# Patient Record
Sex: Female | Born: 1990 | Hispanic: Yes | Marital: Married | Smoking: Never smoker
Health system: Southern US, Community
[De-identification: ages and names within clinical notes are randomized; demographics above are authoritative.]

## PROBLEM LIST (undated history)

## (undated) ENCOUNTER — Inpatient Hospital Stay (HOSPITAL_COMMUNITY): Payer: Self-pay

## (undated) DIAGNOSIS — Z8759 Personal history of other complications of pregnancy, childbirth and the puerperium: Secondary | ICD-10-CM

## (undated) DIAGNOSIS — O24419 Gestational diabetes mellitus in pregnancy, unspecified control: Secondary | ICD-10-CM

## (undated) HISTORY — DX: Gestational diabetes mellitus in pregnancy, unspecified control: O24.419

## (undated) HISTORY — DX: Personal history of other complications of pregnancy, childbirth and the puerperium: Z87.59

---

## 2011-06-30 ENCOUNTER — Emergency Department (HOSPITAL_COMMUNITY)
Admission: EM | Admit: 2011-06-30 | Discharge: 2011-07-01 | Disposition: A | Discharge status: Home / Self Care | Payer: Self-pay | Attending: Emergency Medicine | Admitting: Emergency Medicine

## 2011-06-30 ENCOUNTER — Encounter: Payer: Self-pay | Admitting: Emergency Medicine

## 2011-06-30 DIAGNOSIS — R109 Unspecified abdominal pain: Secondary | ICD-10-CM | POA: Insufficient documentation

## 2011-06-30 DIAGNOSIS — O039 Complete or unspecified spontaneous abortion without complication: Secondary | ICD-10-CM

## 2011-06-30 DIAGNOSIS — N390 Urinary tract infection, site not specified: Secondary | ICD-10-CM

## 2011-06-30 DIAGNOSIS — N898 Other specified noninflammatory disorders of vagina: Secondary | ICD-10-CM | POA: Insufficient documentation

## 2011-06-30 LAB — CBC
HCT: 36.3 % (ref 36.0–46.0)
Hemoglobin: 12.3 g/dL (ref 12.0–15.0)
MCV: 83.6 fL (ref 78.0–100.0)
RBC: 4.34 MIL/uL (ref 3.87–5.11)
WBC: 8.6 10*3/uL (ref 4.0–10.5)

## 2011-06-30 LAB — BASIC METABOLIC PANEL
CO2: 25 mEq/L (ref 19–32)
Chloride: 103 mEq/L (ref 96–112)
Creatinine, Ser: 0.65 mg/dL (ref 0.50–1.10)
GFR calc Af Amer: >90 mL/min (ref >90)
Potassium: 3.3 mEq/L — ABNORMAL LOW (ref 3.5–5.1)
Sodium: 138 mEq/L (ref 135–145)

## 2011-06-30 LAB — DIFFERENTIAL
Basophils Absolute: 0 10*3/uL (ref 0.0–0.1)
Eosinophils Relative: 4 % (ref 0–5)
Lymphocytes Relative: 27 % (ref 12–46)
Lymphs Abs: 2.3 10*3/uL (ref 0.7–4.0)
Monocytes Absolute: 0.5 10*3/uL (ref 0.1–1.0)
Monocytes Relative: 6 % (ref 3–12)
Neutro Abs: 5.4 10*3/uL (ref 1.7–7.7)

## 2011-06-30 NOTE — ED Notes (Signed)
PT. REPORTS VAGINAL BLEEDING ONSET YESTERDAY ,  LOWER ABDOMINAL CRAMPING , DENIES VOMMITTING OR DIARRHEA . LMP  SEPT. 15 , 2012 - DENIES PRENATAL VISITS .

## 2011-06-30 NOTE — ED Notes (Signed)
Pt reports pelvic cramping and vaginal bleeding; Pad had moderate amount of bright red blood. No clots.

## 2011-07-01 ENCOUNTER — Emergency Department (HOSPITAL_COMMUNITY): Payer: Self-pay

## 2011-07-01 LAB — URINALYSIS, ROUTINE W REFLEX MICROSCOPIC
Glucose, UA: NEGATIVE mg/dL
Ketones, ur: 15 mg/dL — AB
Nitrite: NEGATIVE
Specific Gravity, Urine: 1.015 (ref 1.005–1.030)
pH: 6.5 (ref 5.0–8.0)

## 2011-07-01 LAB — URINE MICROSCOPIC-ADD ON

## 2011-07-01 LAB — TYPE AND SCREEN: Antibody Screen: NEGATIVE

## 2011-07-01 LAB — HCG, QUANTITATIVE, PREGNANCY: hCG, Beta Chain, Quant, S: 4600 m[IU]/mL — ABNORMAL HIGH (ref <5)

## 2011-07-01 MED ORDER — IBUPROFEN 800 MG PO TABS: ORAL_TABLET | ORAL | Status: DC

## 2011-07-01 MED ORDER — NITROFURANTOIN MONOHYD MACRO 100 MG PO CAPS
100.0000 mg | ORAL_CAPSULE | ORAL | Status: AC
  Administered 2011-07-01: 100 mg via ORAL
  Filled 2011-07-01: qty 1

## 2011-07-01 MED ORDER — IBUPROFEN 800 MG PO TABS
800.0000 mg | ORAL_TABLET | Freq: Once | ORAL | Status: AC
  Administered 2011-07-01: 800 mg via ORAL
  Filled 2011-07-01: qty 1

## 2011-07-01 MED ORDER — NITROFURANTOIN MONOHYD MACRO 100 MG PO CAPS: 100.0000 mg | ORAL_CAPSULE | Freq: Two times a day (BID) | ORAL | Status: AC

## 2011-07-01 NOTE — ED Notes (Signed)
Pt still in US

## 2011-07-01 NOTE — ED Notes (Signed)
MD at bedside with RN; interpreter phone being utilized to explain plan of care.

## 2011-07-01 NOTE — ED Notes (Signed)
Patient transported to Ultrasound 

## 2011-07-01 NOTE — ED Provider Notes (Signed)
History     CSN: [id number] Arrival date & time: 06/30/2011  9:05 PM   First MD Initiated Contact with Patient 07/01/11 0003      Chief Complaint  Patient presents with  . Vaginal Bleeding    (Consider location/radiation/quality/duration/timing/severity/associated sxs/prior treatment) HPI (History is obtained with the assistance of [employer] interpreter service.) This is a 19 year old Hispanic female with a complaint of vaginal bleeding since yesterday. It is associated with lower abdominal cramping. Her last menstrual period was September 15 of this year. She's not sure if she is pregnant. The bleeding is heavier than a period and is accompanied by clots. She is also having suprapubic pain that began about 7:30. She cannot characterize this pain other than to state that it feels like a prior miscarriage. This pain is moderate to severe and worse with palpation or movement. She denies nausea or vomiting. She denies fever or chills.  History reviewed. No pertinent past medical history.  History reviewed. No pertinent past surgical history.  No family history on file.  History  Substance Use Topics  . Smoking status: Never Smoker   . Smokeless tobacco: Not on file  . Alcohol Use: No    OB History    Grav Para Term Preterm Abortions TAB SAB Ect Mult Living                  Review of Systems  All other systems reviewed and are negative.    Allergies  Review of patient's allergies indicates no known allergies.  Home Medications  No current outpatient prescriptions on file.  BP 99/66  Pulse 77  Temp(Src) 97.3 F (36.3 C) (Oral)  Resp 18  LMP 04/11/2011  Physical Exam General: Well-developed, well-nourished female in no acute distress; appearance consistent with age of record HENT: normocephalic, atraumatic Eyes: Normal appearance Neck: supple Heart: regular rate and rhythm Lungs: clear to auscultation bilaterally Abdomen: soft; suprapubic tenderness; nondistended;  no masses or hepatosplenomegaly; bowel sounds present GU: Normal external genitalia; blood and clots in vagina; cervical os closed; urine tenderness Extremities: No deformity; full range of motion; pulses normal Neurologic: Awake, alert and oriented; motor function intact in all extremities and symmetric; no facial droop Skin: Warm and dry    ED Course  Procedures (including critical care time)     MDM   Nursing notes and vitals signs, including pulse oximetry, reviewed.  Summary of this visit's results, reviewed by myself:  Labs:  Results for orders placed during the hospital encounter of 06/30/11  URINALYSIS, ROUTINE W REFLEX MICROSCOPIC      Component Value Range   Color, Urine RED (*) YELLOW    APPearance TURBID (*) CLEAR    Specific Gravity, Urine 1.015  1.005 - 1.030    pH 6.5  5.0 - 8.0    Glucose, UA NEGATIVE  NEGATIVE (mg/dL)   Hgb urine dipstick LARGE (*) NEGATIVE    Bilirubin Urine MODERATE (*) NEGATIVE    Ketones, ur 15 (*) NEGATIVE (mg/dL)   Protein, ur 409 (*) NEGATIVE (mg/dL)   Urobilinogen, UA 1.0  0.0 - 1.0 (mg/dL)   Nitrite NEGATIVE  NEGATIVE    Leukocytes, UA MODERATE (*) NEGATIVE   BASIC METABOLIC PANEL      Component Value Range   Sodium 138  135 - 145 (mEq/L)   Potassium 3.3 (*) 3.5 - 5.1 (mEq/L)   Chloride 103  96 - 112 (mEq/L)   CO2 25  19 - 32 (mEq/L)   Glucose, Bld 89  70 - 99 (mg/dL)   BUN 13  6 - 23 (mg/dL)   Creatinine, Ser 4.09  0.50 - 1.10 (mg/dL)   Calcium 9.3  8.4 - 81.1 (mg/dL)   GFR calc non Af Amer >90  >90 (mL/min)   GFR calc Af Amer >90  >90 (mL/min)  CBC      Component Value Range   WBC 8.6  4.0 - 10.5 (K/uL)   RBC 4.34  3.87 - 5.11 (MIL/uL)   Hemoglobin 12.3  12.0 - 15.0 (g/dL)   HCT 91.4  78.2 - 95.6 (%)   MCV 83.6  78.0 - 100.0 (fL)   MCH 28.3  26.0 - 34.0 (pg)   MCHC 33.9  30.0 - 36.0 (g/dL)   RDW 21.3  08.6 - 57.8 (%)   Platelets 248  150 - 400 (K/uL)  DIFFERENTIAL      Component Value Range   Neutrophils  Relative 63  43 - 77 (%)   Neutro Abs 5.4  1.7 - 7.7 (K/uL)   Lymphocytes Relative 27  12 - 46 (%)   Lymphs Abs 2.3  0.7 - 4.0 (K/uL)   Monocytes Relative 6  3 - 12 (%)   Monocytes Absolute 0.5  0.1 - 1.0 (K/uL)   Eosinophils Relative 4  0 - 5 (%)   Eosinophils Absolute 0.4  0.0 - 0.7 (K/uL)   Basophils Relative 1  0 - 1 (%)   Basophils Absolute 0.0  0.0 - 0.1 (K/uL)  POCT PREGNANCY, URINE      Component Value Range   Preg Test, Ur POSITIVE    TYPE AND SCREEN      Component Value Range   ABO/RH(D) O POS     Antibody Screen NEG     Sample Expiration 07/04/2011    HCG, QUANTITATIVE, PREGNANCY      Component Value Range   hCG, Beta Chain, Quant, S 4600 (*) <5 (mIU/mL)  URINE MICROSCOPIC-ADD ON      Component Value Range   Squamous Epithelial / LPF RARE  RARE    WBC, UA 11-20  <3 (WBC/hpf)   RBC / HPF TOO NUMEROUS TO COUNT  <3 (RBC/hpf)   Bacteria, UA FEW (*) RARE   ABO/RH      Component Value Range   ABO/RH(D) O POS     US Ob Comp Less 14 Wks  07/01/2011  *RADIOLOGY REPORT*  Clinical Data: Vaginal bleeding for 2 days.  OBSTETRIC <14 WK [place] AND TRANSVAGINAL OB US  Technique:  Both transabdominal and transvaginal ultrasound examinations were performed for complete evaluation of the gestation as well as the maternal uterus, adnexal regions, and pelvic cul-de-sac.  Transvaginal technique was performed to assess early pregnancy.  Comparison:  None.  Intrauterine gestational sac:  None seen Yolk sac: N/A Embryo: N/A Cardiac Activity: N/A  Maternal uterus/adnexae: The uterus is unremarkable in appearance.  It measures 8.5 x 4.1 x 4.9 cm.  The endometrial echo complex measures 1.7 cm in thickness. Mildly heterogeneous echogenicity along the endometrial echo complex at the lower uterine segment may reflect a small amount of blood within the endometrial canal.  The ovaries are unremarkable in appearance.  The right ovary measures 3.4 x 2.8 x 2.4 cm, while the left ovary measures 1.9 x 1.8 x 1.7 cm.   Limited Doppler evaluation demonstrates normal color Doppler blood flow with respect to both ovaries; there is no evidence for ovarian torsion.  No suspicious adnexal masses are seen; there is no definite evidence  of ectopic pregnancy.  No free fluid is seen within the pelvic cul-de-sac.  IMPRESSION: No intrauterine pregnancy seen.  This likely reflects a missed spontaneous abortion.  Suggestion of minimal blood along the endometrial canal at the lower uterine segment.  Original Report Authenticated By: Tonia Ghent, M.D.              Hanley Seamen, MD 07/01/11 [phone]

## 2011-07-01 NOTE — ED Notes (Signed)
RN explained that pt has urinary tract infection.

## 2011-07-01 NOTE — ED Notes (Signed)
Interpretter phone used to review d/c instructions and prescriptions. All questions answered; A&Ox3; no signs of distress; ambulated with a steady gait.

## 2011-07-03 LAB — URINE CULTURE: Colony Count: 75000

## 2012-07-01 ENCOUNTER — Ambulatory Visit: Payer: Self-pay | Admitting: Emergency Medicine

## 2012-07-01 VITALS — BP 95/71 | HR 87 | Temp 98.4°F | Resp 18 | Ht 61.5 in | Wt 157.0 lb

## 2012-07-01 DIAGNOSIS — Z3200 Encounter for pregnancy test, result unknown: Secondary | ICD-10-CM

## 2012-07-01 LAB — POCT URINE PREGNANCY: Preg Test, Ur: POSITIVE

## 2012-07-01 MED ORDER — PRENATAL VITAMINS 28-0.8 MG PO TABS: 1.0000 | ORAL_TABLET | Freq: Every day | ORAL | Status: DC

## 2012-07-01 NOTE — Progress Notes (Signed)
[facility] [address], [place] [address] [phone]  Date:  07/01/2012   Name:  Julie Allen   DOB:  Jun 12, 1991   MRN:  [id number]  PCP:  No primary provider on file.    Chief Complaint: Possible Pregnancy   History of Present Illness:  Julie Allen is a 21 y.o. very pleasant female patient who presents with the following:  Last menses in September.  Has positive  HPT.  Requests HCG here for documentation.  No other complaints.   There is no problem list on file for this patient.   No past medical history on file.  No past surgical history on file.  History  Substance Use Topics  . Smoking status: Never Smoker   . Smokeless tobacco: Not on file  . Alcohol Use: No    No family history on file.  No Known Allergies  Medication list has been reviewed and updated.  Current Outpatient Prescriptions on File Prior to Visit  Medication Sig Dispense Refill  . ibuprofen (ADVIL,MOTRIN) 800 MG tablet Take 1 tablet 3 times daily as needed for pain. Best taken with a meal  20 tablet  0    Review of Systems:  As per HPI, otherwise negative.    Physical Examination: Filed Vitals:   07/01/12 1054  BP: 95/71  Pulse: 87  Temp: 98.4 F (36.9 C)  Resp: 18   Filed Vitals:   07/01/12 1054  Height: 5' 1.5" (1.562 m)  Weight: 157 lb (71.215 kg)   Body mass index is 29.18 kg/(m^2). Ideal Body Weight: Weight in (lb) to have BMI = 25: 134.2    GEN: WDWN, NAD, Non-toxic, Alert & Oriented x 3 HEENT: Atraumatic, Normocephalic.  Ears and Nose: No external deformity. EXTR: No clubbing/cyanosis/edema NEURO: Normal gait.  PSYCH: Normally interactive. Conversant. Not depressed or anxious appearing.  Calm demeanor.    Assessment and Plan: Amenorrhea Pregnancy UCG Prenatal vitamins  Carmelina Dane, MD  Results for orders placed in visit on 07/01/12  POCT URINE PREGNANCY      Component Value Range   Preg Test, Ur Positive

## 2012-07-01 NOTE — Patient Instructions (Addendum)
Media planner (Pregnancy) Si planea quedar embarazada, es una buena idea concertar una cita de preconcepcin con el mdico para poder lograr un estilo de vida saludable ante de quedar embarazada. Esto incluye dieta, peso, ejercicio, el tomar vitaminas prenatales en especial cido flico (ayuda a prevenir defectos en el cerebro y la mdula espinal), evitar el alcohol, fumar, las drogas ilegales, problemas mdicos (diabetes, convulsiones), historial familiar de problemas genticos, condiciones de trabajo e inmunizaciones. Es mejor tener conocimiento de estas cosas y Field seismologist algo antes de quedar embarazada. Si est embarazada, es necesario que siga ciertas pautas para tener un beb sano. Es muy importante Optometrist controles prenatales adecuados y seguir las indicaciones del profesional que la asiste. La atencin prenatal incluye toda la asistencia mdica que usted recibe antes del nacimiento del beb. Esto ayuda a Risk analyst y [place]. INSTRUCCIONES PARA EL CUIDADO DOMICILIARIO  Comience las consultas prenatales alrededor de la 12 semana de embarazo o lo antes posible. Al principio generalmente se programan cada mes. Se hacen ms frecuentes en los 2 ltimos meses antes del parto. Es importante que concurra a todas las citas con el profesional y siga sus instrucciones con Engineer, site a los medicamentos que deba Risk manager, a la actividad fsica y a Engineer, technical sales.  Durante el embarazo debe obtener nutrientes para usted y para su beb. Consuma una dieta normal y bien balanceada. Elija alimentos como carne, pescado, [place] y otros productos lcteos, vegetales, frutas, panes integrales y Therapist, art cul es el aumento de [place] ideal, segn su peso y Chief Strategy Officer. Beba gran cantidad de lquidos. Trate de beber 8 vasos de lquidos por Training and development officer.  El alcohol se asocia a cierto nmero de defectos del nacimiento, incluyendo el sndrome de alcoholismo fetal. Lo mejor es evitarlo  completamente El cigarrillo causa nacimientos prematuros y bebs de bajo peso al Associate Professor. El consumo de alcohol y nicotina durante el embarazo tambin aumentan marcadamente la probabilidad de que el nio sea qumicamente dependiente en etapas posteriores de su vida y puede contribuir al sndrome de muerte sbita infantil (SMSI)  No consuma drogas.  Solo tome medicamentos prescriptos o de venta libre que le haya recomendado el profesional. Algunos medicamentos pueden causar problemas genticos y fsicos al beb  Las nuseas matinales pueden aliviarse si come Firefighter en la cama. Coma dos galletitas antes de levantarse por la maana.  Las relaciones sexuales pueden continuarse hasta casi el final del embarazo, si no se presentan otros problemas como prdida prematura (antes de tiempo) de lquido amnitico, Social research officer, government vaginal, dolor durante las relaciones sexuales o dolor abdominal (en el vientre).  Practique ejercicios con regularidad. Consulte con el profesional que la asiste si no sabe con certeza si determinados ejercicios son seguros.  No utilice la baera con agua caliente, baos turcos y saunas. Estos aumentan el riesgo de sufrir un desmayo o de prdida del conocimiento, y as Glass blower/designer usted o el beb. La natacin es un buen ejercicio. Descanse todo lo que pueda e incluya una siesta despus de almorzar siempre que le sea posible, especialmente durante el tercer trimestre.  Evite los olores y las sustancias qumicas txicas.  No use zapatos de tacones altos, podra perder el equilibrio y caer.  No levante objetos de ms de 2,5 kg. Si levanta un objeto, flexione las piernas y los muslos, y no la espalda.  Evite los viajes largos, Art gallery manager trimestre.  Si debe viajar fuera de la ciudad o de su [place], lleve [place]  copia de la historia clnica. SOLICITE ATENCIN MDICA DE INMEDIATO SI:  La temperatura oral se eleva sin motivo por encima de 102 F (38.9 C) o  segn le indique el profesional que lo asiste.  Tiene una prdida de lquido por la vagina. Si sospecha una ruptura de las [place], tmese la temperatura y llame al profesional para informarlo sobre esto.  Observa unas pequeas manchas o una hemorragia vaginal Notifique al profesional acerca de la cantidad y de cuntos apsitos est utilizando.  Contina teniendo nuseas y no obtiene alivio de los [employer] han [place], o vomita sangre o una sustancia similar a la borra del caf.  Presenta un dolor en la zona superior del abdomen.  Siente molestias en el ligamento redondo en la parte abdominal baja. El profesional que la asiste Hydrologist.  Siente pequeas contracciones del tero (matriz)  No siente que el beb se mueve, o percibe menos movimientos que antes.  Siente dolor al [employer].  Brett Fairy hemorragia vaginal anormal.  Tiene diarrea persistente.  Sufre una cefalea grave.  Tiene problemas visuales.  Comienza a sentir debilidad muscular.  Se siente mareada o sufre un desmayo.  Comienza a sentir falta de aire.  Siente dolor en el pecho.  Sufre dolor en la espalda que se irradia hacia la pierna y el pie.  Siente latidos cardacos irregulares o la frecuencia cardaca es muy rpida.  Aumenta excesivamente de peso en un perodo breve (2,5 kg en 3 a 5 das)  Se ve envuelta en una situacin de violencia domstica. Document Released: 04/22/2005 Document Revised: 10/05/2011 [facility] Patient Information 2013 [place], [place].

## 2012-08-16 ENCOUNTER — Other Ambulatory Visit: Payer: Self-pay

## 2012-08-16 DIAGNOSIS — Z331 Pregnant state, incidental: Secondary | ICD-10-CM

## 2012-08-16 NOTE — Progress Notes (Signed)
PRENATAL LABS ONLY Julie Allen

## 2012-08-17 LAB — OBSTETRIC PANEL
Basophils Absolute: 0 10*3/uL (ref 0.0–0.1)
Eosinophils Absolute: 0.2 10*3/uL (ref 0.0–0.7)
Eosinophils Relative: 3 % (ref 0–5)
Hepatitis B Surface Ag: NEGATIVE
MCH: 26.9 pg (ref 26.0–34.0)
MCV: 81.3 fL (ref 78.0–100.0)
Neutrophils Relative %: 73 % (ref 43–77)
Platelets: 309 10*3/uL (ref 150–400)
RBC: 4.91 MIL/uL (ref 3.87–5.11)
RDW: 16.7 % — ABNORMAL HIGH (ref 11.5–15.5)
WBC: 8.9 10*3/uL (ref 4.0–10.5)

## 2012-08-17 LAB — SICKLE CELL SCREEN: Sickle Cell Screen: NEGATIVE

## 2012-08-17 LAB — HIV ANTIBODY (ROUTINE TESTING W REFLEX): HIV: NONREACTIVE

## 2012-08-19 LAB — CULTURE, OB URINE: Colony Count: >=100000

## 2012-08-22 ENCOUNTER — Telehealth: Payer: Self-pay | Admitting: Family Medicine

## 2012-08-22 DIAGNOSIS — N390 Urinary tract infection, site not specified: Secondary | ICD-10-CM

## 2012-08-22 MED ORDER — CEPHALEXIN 500 MG PO CAPS: 500.0000 mg | ORAL_CAPSULE | Freq: Three times a day (TID) | ORAL | Status: DC

## 2012-08-22 NOTE — Telephone Encounter (Signed)
Called pt to inform her about positive urine culture.  Left message that she needs to call us back. Pt needs to start taking antibiotic for this but there is not pharmacy listed on her chart. Medication prescription printed and placed on my box.

## 2012-08-23 ENCOUNTER — Encounter: Payer: Self-pay | Admitting: Family Medicine

## 2012-08-23 ENCOUNTER — Ambulatory Visit (INDEPENDENT_AMBULATORY_CARE_PROVIDER_SITE_OTHER): Payer: Self-pay | Admitting: Family Medicine

## 2012-08-23 VITALS — BP 111/70 | Temp 98.3°F | Wt 157.4 lb

## 2012-08-23 DIAGNOSIS — N760 Acute vaginitis: Secondary | ICD-10-CM

## 2012-08-23 DIAGNOSIS — Z331 Pregnant state, incidental: Secondary | ICD-10-CM

## 2012-08-23 DIAGNOSIS — Z3490 Encounter for supervision of normal pregnancy, unspecified, unspecified trimester: Secondary | ICD-10-CM | POA: Insufficient documentation

## 2012-08-23 DIAGNOSIS — R8271 Bacteriuria: Secondary | ICD-10-CM | POA: Insufficient documentation

## 2012-08-23 DIAGNOSIS — N390 Urinary tract infection, site not specified: Secondary | ICD-10-CM

## 2012-08-23 LAB — GLUCOSE, CAPILLARY: Glucose-Capillary: 138 mg/dL — ABNORMAL HIGH (ref 70–99)

## 2012-08-23 LAB — POCT WET PREP (WET MOUNT)

## 2012-08-23 MED ORDER — CEPHALEXIN 500 MG PO CAPS: 500.0000 mg | ORAL_CAPSULE | Freq: Two times a day (BID) | ORAL | Status: DC

## 2012-08-23 NOTE — Assessment & Plan Note (Signed)
Urine Culture positive for enterococcus. Treated with Keflex. TOC in a week after completion of therapy.

## 2012-08-23 NOTE — Progress Notes (Signed)
  Subjective:    Julie Allen is a [id number] at [redacted]w[redacted]d being seen today for her first obstetrical visit.  Her obstetrical history is significant for Fetal Demise at 28 weeks of her first pregnancy and Pre-eclampsia with Classical C section on her second one. Patient reports no complaints. Denies headache, visual disturbances, abdominal pain, swelling or vaginal bleeding.   Filed Vitals:   08/23/12 1001  BP: 111/70  Temp: 98.3 F (36.8 C)  Weight: 157 lb 6.4 oz (71.396 kg)   HISTORY: OB History    Grav Para Term Preterm TAB SAB Ect Mult Living   4 2 1 1  1   1      # Outc Date GA Wgt Sex Del Lv   1 PRE 2008 28w   SVD SB   2 TRM 2010 37w 7lb11.5oz(3.5kg) F CCS Yes   Comments: c section secondary to pre-eclampsia   3 SAB 2012 8w  U  No   4 CUR         No past medical history on file. Past Surgical History  Procedure Date  . Cesarean section    Exam   Uterus:   Anteverted. Size corresponds with GA.  Pelvic Exam:    Perineum: No Hemorrhoids   Vulva: normal   Vagina:  normal mucosa, curdlike discharge, wet prep done   Cervix: multiparous appearance   Adnexa: normal adnexa               System: Breast:  normal appearance, no masses or tenderness   Skin: normal coloration and turgor, no rashes   Neurologic: oriented, normal   Extremities: normal strength, tone, and muscle mass   HEENT PERRLA   Mouth/Teeth mucous membranes moist, pharynx normal without lesions   Neck supple and no masses   Cardiovascular: regular rate and rhythm, no murmurs or gallops   Respiratory:  normal RR, chest clear, no wheezing or rales.   Abdomen: normal findings: vertical scar below umbilicus.   Urinary: urethral meatus normal. No CVA tenderness   Assessment:    Pregnancy: G1P0 Patient Active Problem List  Diagnosis  . Asymptomatic bacteriuria in pregnancy  . Pregnancy   Plan:   Initial labs reviewed. Pt has asymptomatic bacteriuria. Treatment with Keflex recommended, prescription  given. Will retest TOC next visit. 1h GTT drawn today. Varicella titer ordered as well.  Pap Smear, GC and Chlamydia and also wet prep obtained today. Hx of fetal demise. Consult and evaluation by High Risk OB (referral placed pt will be contacted with appointment schedule) OB U/S anatomy: Ordered and scheduled with Dr. Tawny Hopping for Jan 31 at 10:30 am. PHQ-9 scored 1( on item #4)  Pregnancy Medical Home risk tool filled. No problems identified.  Genetic Screening discussed Quad Screen: declined. Follow up in 4 weeks.   Lillia Abed 08/23/2012

## 2012-08-23 NOTE — Patient Instructions (Addendum)
Ha sido un placer conocerla hoy. MUCHAS FELICIDADES!!!!  Estas son las indicaciones/recommendaciones de esta visita.  1. Tiene una infeccion en la orina, por favor tome el medicamento recetado. En la proxima cita le haremos una prueba de [place] para ver si ya no tiene esa bacteria.   2. [place] cita para la consulta de [place] [facility]. Adine Madura a llamar por el telefono para informarle el dia y Public relations account executive.  3. Tambien necesita hacerse el ultrasonido anatomico que corresponde a su edad gestacional. Este es sumamente importante, no debe pasar de estas cuatro semanas.  4. Haga una cita conmigo en 4 semanas. (make an appointment with me in 4 weeks)

## 2012-08-24 LAB — VARICELLA ZOSTER ANTIBODY, IGG: Varicella IgG: <10 Index (ref <135.00)

## 2012-08-29 NOTE — Progress Notes (Signed)
Patient chart reviewed.  Agree with referral to HROB in light of IUFD at 28 weeks in prior pregnancy.  Will need UCx for TOC after treatment. Paula Compton, MD

## 2012-08-30 ENCOUNTER — Other Ambulatory Visit (INDEPENDENT_AMBULATORY_CARE_PROVIDER_SITE_OTHER): Payer: Self-pay

## 2012-08-30 DIAGNOSIS — Z331 Pregnant state, incidental: Secondary | ICD-10-CM

## 2012-08-30 LAB — GLUCOSE, CAPILLARY: Glucose-Capillary: 85 mg/dL (ref 70–99)

## 2012-08-30 NOTE — Progress Notes (Signed)
UNABLE TO CONTINUE WITH THE 3 HR GTT ,PT VOMITED. RESCHEDULED FOR 09/07/11 Julie Allen

## 2012-09-06 ENCOUNTER — Other Ambulatory Visit (INDEPENDENT_AMBULATORY_CARE_PROVIDER_SITE_OTHER): Payer: Self-pay

## 2012-09-06 DIAGNOSIS — Z331 Pregnant state, incidental: Secondary | ICD-10-CM

## 2012-09-06 DIAGNOSIS — Z3480 Encounter for supervision of other normal pregnancy, unspecified trimester: Secondary | ICD-10-CM

## 2012-09-06 LAB — GLUCOSE, CAPILLARY: Glucose-Capillary: 88 mg/dL (ref 70–99)

## 2012-09-06 NOTE — Progress Notes (Signed)
3 HR GTT DONE TODAY Julie Allen 

## 2012-09-07 LAB — GLUCOSE TOLERANCE, 3 HOURS
Glucose Tolerance, 2 hour: 137 mg/dL (ref 70–164)
Glucose, GTT - 3 Hour: 119 mg/dL (ref 70–144)

## 2012-09-15 ENCOUNTER — Ambulatory Visit (INDEPENDENT_AMBULATORY_CARE_PROVIDER_SITE_OTHER): Payer: Self-pay | Admitting: Obstetrics and Gynecology

## 2012-09-15 ENCOUNTER — Encounter: Payer: Self-pay | Admitting: Obstetrics and Gynecology

## 2012-09-15 VITALS — BP 110/71 | Temp 97.5°F | Wt 159.6 lb

## 2012-09-15 DIAGNOSIS — O09299 Supervision of pregnancy with other poor reproductive or obstetric history, unspecified trimester: Secondary | ICD-10-CM | POA: Insufficient documentation

## 2012-09-15 DIAGNOSIS — O34219 Maternal care for unspecified type scar from previous cesarean delivery: Secondary | ICD-10-CM | POA: Insufficient documentation

## 2012-09-15 DIAGNOSIS — Z23 Encounter for immunization: Secondary | ICD-10-CM

## 2012-09-15 LAB — POCT URINALYSIS DIP (DEVICE)
Ketones, ur: NEGATIVE mg/dL
Protein, ur: NEGATIVE mg/dL

## 2012-09-15 MED ORDER — INFLUENZA VIRUS VACC SPLIT PF IM SUSP
0.5000 mL | Freq: Once | INTRAMUSCULAR | Status: AC
  Administered 2012-09-15: 0.5 mL via INTRAMUSCULAR

## 2012-09-15 NOTE — Progress Notes (Signed)
Patient doing well without complaints. Informed of abnormal pap smear and need for colposcopy. Discussed TOLAC vs rpt c-section- patient more inclined to TOLAC.

## 2012-09-15 NOTE — Progress Notes (Signed)
Pulse-89 Weight gain 25-35lb New ob packet given Flu consent signed

## 2012-09-21 ENCOUNTER — Ambulatory Visit (INDEPENDENT_AMBULATORY_CARE_PROVIDER_SITE_OTHER): Payer: Self-pay | Admitting: Family Medicine

## 2012-09-21 VITALS — BP 117/66 | Temp 98.7°F | Wt 159.0 lb

## 2012-09-21 DIAGNOSIS — Z331 Pregnant state, incidental: Secondary | ICD-10-CM

## 2012-09-21 NOTE — Patient Instructions (Addendum)
Su embarazo se considera de [address]. Julie Allen en adelante necesita seguirse con la clinica de "High Risk OB" que esta en [facility] de la [place] donde se antendio previamente. Su proxima cita con ellos sera el mes proximo el dia 6 y el dia 13.

## 2012-09-21 NOTE — Progress Notes (Signed)
Julie Allen is a 22 y.o. [id number] at [redacted]w[redacted]d for routine follow up.  She reports no complaints. See flow sheet for details.  A/P: Pregnancy at [redacted]w[redacted]d. Doing well.   Pregnancy issues include:  -High risk for Hx of IUFD at 28 weeks. 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  -Pt also has prior Vertical C Section planning for TOLAC -Hx of Asymptomatic bacteriuria treated pending TOC. -1h GTT elevated but 3h GTT wnl. Anatomy scan reviewed, no problems noted.  Preterm labor precautions reviewed. Discussed with pt that from now on she needs to follow up with HROB clinic.

## 2012-09-29 ENCOUNTER — Encounter: Payer: Self-pay | Admitting: Obstetrics & Gynecology

## 2012-09-29 ENCOUNTER — Ambulatory Visit (INDEPENDENT_AMBULATORY_CARE_PROVIDER_SITE_OTHER): Payer: Self-pay | Admitting: Obstetrics & Gynecology

## 2012-09-29 VITALS — BP 114/72 | HR 91 | Temp 97.9°F | Ht 61.5 in | Wt 161.9 lb

## 2012-10-04 NOTE — Patient Instructions (Signed)
Return to clinic for any scheduled appointments or for any gynecologic concerns as needed.   

## 2012-10-04 NOTE — Progress Notes (Signed)
GYN Clinic Attending Note  Patient was sent here for colposcopy for LGSIL pap smear on 08/23/12.  As per ASCCP guidelines, she does not need colposcopy and just needs repeat cytology in one year.  This was explained to the patient via a Spanish interpreter.  Patient will follow up her her PCP for routine care.  Jaynie Collins, MD, FACOG Attending Obstetrician & Gynecologist Faculty Practice, [facility] [place]

## 2012-10-06 ENCOUNTER — Ambulatory Visit (INDEPENDENT_AMBULATORY_CARE_PROVIDER_SITE_OTHER): Payer: Self-pay | Admitting: Advanced Practice Midwife

## 2012-10-06 ENCOUNTER — Other Ambulatory Visit: Payer: Self-pay | Admitting: Obstetrics & Gynecology

## 2012-10-06 ENCOUNTER — Encounter: Payer: Self-pay | Admitting: Obstetrics & Gynecology

## 2012-10-06 VITALS — BP 115/79 | Temp 97.6°F | Wt 164.1 lb

## 2012-10-06 DIAGNOSIS — Z3490 Encounter for supervision of normal pregnancy, unspecified, unspecified trimester: Secondary | ICD-10-CM

## 2012-10-06 LAB — POCT URINALYSIS DIP (DEVICE)
Bilirubin Urine: NEGATIVE
Glucose, UA: NEGATIVE mg/dL
Hgb urine dipstick: NEGATIVE
Ketones, ur: NEGATIVE mg/dL
Nitrite: POSITIVE — AB
Protein, ur: NEGATIVE mg/dL
Specific Gravity, Urine: 1.02 (ref 1.005–1.030)
Urobilinogen, UA: 0.2 mg/dL (ref 0.0–1.0)
pH: 6.5 (ref 5.0–8.0)

## 2012-10-06 NOTE — Patient Instructions (Signed)
Embarazo - Segundo trimestre (Pregnancy - Second Trimester) El segundo trimestre del embarazo (del 3 al 6mes) es un perodo de evolucin rpida para usted y el beb. Hacia el final del sexto mes, el beb mide aproximadamente 23 cm y pesa 680 g. Comenzar a sentir los movimientos del beb entre las 18 y las 20 semanas de embarazo. Podr sentir las pataditas ("quickening en ingls"). Hay un rpido aumento de peso. Puede segregar un lquido claro (calostro) de las mamas. Quizs sienta pequeas contracciones en el vientre (tero) Esto se conoce como falso trabajo de parto o contracciones de Braxton-Hicks. Es como una prctica del trabajo de parto que se produce cuando el beb est listo para salir. Generalmente los problemas de vmitos matinales ya se han superado hacia el final del primer trimestre. Algunas mujeres desarrollan pequeas manchas oscuras (que se denominan cloasma, mscara del embarazo) en la cara que normalmente se van luego del nacimiento del beb. La exposicin al sol empeora las manchas. Puede desarrollarse acn en algunas mujeres embarazadas, y puede desaparecer en aquellas que ya tienen acn. EXAMENES PRENATALES  Durante los exmenes prenatales, deber seguir realizando pruebas de sangre, segn avance el embarazo. Estas pruebas se realizan para controlar su salud y la del beb. Tambin se realizan anlisis de sangre para conocer los niveles de hemoglobina. La anemia (bajo nivel de hemoglobina) es frecuente durante el embarazo. Para prevenirla, se administran hierro y vitaminas. Tambin se le realizarn exmenes para saber si tiene diabetes entre las 24 y las 28 semanas del embarazo. Podrn repetirle algunas de las pruebas que le hicieron previamente.  En cada visita le medirn el tamao del tero. Esto se realiza para asegurarse de que el beb est creciendo correctamente de acuerdo al estado del embarazo.  Tambin en cada visita prenatal controlarn su presin arterial. Esto se realiza  para asegurarse de que no tenga toxemia.  Se controlar su orina para asegurarse de que no tenga infecciones, diabetes o protena en la orina.  Se controlar su peso regularmente para asegurarse que el aumento ocurre al ritmo indicado. Esto se hace para asegurarse que usted y el beb tienen una evolucin normal.  En algunas ocasiones se realiza una prueba de ultrasonido para confirmar el correcto desarrollo y evolucin del beb. Esta prueba se realiza con ondas sonoras inofensivas para el beb, de modo que el profesional pueda calcular ms precisamente la fecha del parto. Algunas veces se realizan pruebas especializadas del lquido amnitico que rodea al beb. Esta prueba se denomina amniocentesis. El lquido amnitico se obtiene introduciendo una aguja en el vientre (abdomen). Se realiza para controlar los cromosomas en aquellos casos en los que existe alguna preocupacin acerca de algn problema gentico que pueda sufrir el beb. En ocasiones se lleva a cabo cerca del final del embarazo, si es necesario inducir al parto. En este caso se realiza para asegurarse que los pulmones del beb estn lo suficientemente maduros como para que pueda vivir fuera del tero. CAMBIOS QUE OCURREN EN EL SEGUNDO TRIMESTRE DEL EMBARAZO Su organismo atravesar numerosos cambios durante el embarazo. Estos pueden variar de una persona a otra. Converse con el profesional que la asiste acerca los cambios que usted note y que la preocupen.  Durante el segundo trimestre probablemente sienta un aumento del apetito. Es normal tener "antojos" de ciertas comidas. Esto vara de una persona a otra y de un embarazo a otro.  El abdomen inferior comenzar a abultarse.  Podr tener la necesidad de orinar con ms frecuencia debido a que   el tero y el beb presionan sobre la vejiga. Tambin es frecuente contraer ms infecciones urinarias durante el embarazo (dolor al orinar). Puede evitarlas bebiendo gran cantidad de lquidos y vaciando  la vejiga antes y despus de mantener relaciones sexuales.  Podrn aparecer las primeras estras en las caderas, abdomen y mamas. Estos son cambios normales del cuerpo durante el embarazo. No existen medicamentos ni ejercicios que puedan prevenir estos cambios.  Es posible que comience a desarrollar venas inflamadas y abultadas (varices) en las piernas. El uso de medias de descanso, elevar sus pies durante 15 minutos, 3 a 4 veces al da y limitar la sal en su dieta ayuda a aliviar el problema.  Podr sentir acidez gstrica a medida que el tero crece y presiona contra el estmago. Puede tomar anticidos, con la autorizacin de su mdico, para aliviar este problema. Tambin es til ingerir pequeas comidas 4 a 5 veces al da.  La constipacin puede tratarse con un laxante o agregando fibra a su dieta. Beber grandes cantidades de lquidos, comer vegetales, frutas y granos integrales es de gran ayuda.  Tambin es beneficioso practicar actividad fsica. Si ha sido una persona activa hasta el embarazo, podr continuar con la mayora de las actividades durante el mismo. Si ha sido menos activa, puede ser beneficioso que comience con un programa de ejercicios, como realizar caminatas.  Puede desarrollar hemorroides (vrices en el recto) hacia el final del segundo trimestre. Tomar baos de asiento tibios y utilizar cremas recomendadas por el profesional que lo asiste sern de ayuda para los problemas de hemorroides.  Tambin podr sentir dolor de espalda durante este momento de su embarazo. Evite levantar objetos pesados, utilice zapatos de taco bajo y mantenga una buena postura para ayudar a reducir los problemas de espalda.  Algunas mujeres embarazadas desarrollan hormigueo y adormecimiento de la mano y los dedos debido a la hinchazn y compresin de los ligamentos de la mueca (sndrome del tnel carpiano). Esto desaparece una vez que el beb nace.  Como sus pechos se agrandan, necesitar un sujetador  ms grande. Use un sostn de soporte, cmodo y de algodn. No utilice un sostn para amamantar hasta el ltimo mes de embarazo si va a amamantar al beb.  Podr observar una lnea oscura desde el ombligo hacia la zona pbica denominada linea nigra.  Podr observar que sus mejillas se ponen coloradas debido al aumento de flujo sanguneo en la cara.  Podr desarrollar "araitas" en la cara, cuello y pecho. Esto desaparece una vez que el beb nace. INSTRUCCIONES PARA EL CUIDADO DOMICILIARIO  Es extremadamente importante que evite el cigarrillo, hierbas medicinales, alcohol y las drogas no prescriptas durante el embarazo. Estas sustancias qumicas afectan la formacin y el desarrollo del beb. Evite estas sustancias durante todo el embarazo para asegurar el nacimiento de un beb sano.  La mayor parte de los cuidados que se aconsejan son los mismos que los indicados para el primer trimestre del embarazo. Cumpla con las citas tal como se le indic. Siga las instrucciones del profesional que lo asiste con respecto al uso de los medicamentos, el ejercicio y la dieta.  Durante el embarazo debe obtener nutrientes para usted y para su beb. Consuma alimentos balanceados a intervalos regulares. Elija alimentos como carne, pescado, leche y otros productos lcteos descremados, vegetales, frutas, panes integrales y cereales. El profesional le informar cul es el aumento de peso ideal.  Las relaciones sexuales fsicas pueden continuarse hasta cerca del fin del embarazo si no existen otros problemas. Estos   problemas pueden ser la prdida temprana (prematura) de lquido amnitico de las membranas, sangrado vaginal, dolor abdominal u otros problemas mdicos o del embarazo.  Realice actividad fsica todos los das, si no tiene restricciones. Consulte con el profesional que la asiste si no sabe con certeza si determinados ejercicios son seguros. El mayor aumento de peso tiene lugar durante los ltimos 2 trimestres del  embarazo. El ejercicio la ayudar a:  Controlar su peso.  Ponerla en forma para el parto.  Ayudarla a perder peso luego de haber dado a luz.  Use un buen sostn o como los que se usan para hacer deportes para aliviar la sensibilidad de las mamas. Tambin puede serle til si lo usa mientras duerme. Si pierde calostro, podr utilizar apsitos en el sostn.  No utilice la baera con agua caliente, baos turcos y saunas durante el embarazo.  Utilice el cinturn de seguridad sin excepcin cuando conduzca. Este la proteger a usted y al beb en caso de accidente.  Evite comer carne cruda, queso crudo, y el contacto con los utensilios y desperdicios de los gatos. Estos elementos contienen grmenes que pueden causar defectos de nacimiento en el beb.  El segundo trimestre es un buen momento para visitar a su dentista y evaluar su salud dental si an no lo ha hecho. Es importante mantener los dientes limpios. Utilice un cepillo de dientes blando. Cepllese ms suavemente durante el embarazo.  Es ms fcil perder algo de orina durante el embarazo. Apretar y fortalecer los msculos de la pelvis la ayudar con este problema. Practique detener la miccin cuando est en el bao. Estos son los mismos msculos que necesita fortalecer. Son tambin los mismos msculos que utiliza cuando trata de evitar los gases. Puede practicar apretando estos msculos 10 veces, y repetir esto 3 veces por da aproximadamente. Una vez que conozca qu msculos debe apretar, no realice estos ejercicios durante la miccin. Puede favorecerle una infeccin si la orina vuelve hacia atrs.  Pida ayuda si tiene necesidades econmicas, de asesoramiento o nutricionales durante el embarazo. El profesional podr ayudarla con respecto a estas necesidades, o derivarla a otros especialistas.  La piel puede ponerse grasa. Si esto sucede, lvese la cara con un jabn suave, utilice un humectante no graso y maquillaje con base de aceite o  crema. CONSUMO DE MEDICAMENTOS Y DROGAS DURANTE EL EMBARAZO  Contine tomando las vitaminas apropiadas para esta etapa tal como se le indic. Las vitaminas deben contener un miligramo de cido flico y deben suplementarse con hierro. Guarde todas las vitaminas fuera del alcance de los nios. La ingestin de slo un par de vitaminas o tabletas que contengan hierro puede ocasionar la muerte en un beb o en un nio pequeo.  Evite el uso de medicamentos, inclusive los de venta libre y hierbas que no hayan sido prescriptos o indicados por el profesional que la asiste. Algunos medicamentos pueden causar problemas fsicos al beb. Utilice los medicamentos de venta libre o de prescripcin para el dolor, el malestar o la fiebre, segn se lo indique el profesional que lo asiste. No utilice aspirina.  El consumo de alcohol est relacionado con ciertos defectos de nacimiento. Esto incluye el sndrome de alcoholismo fetal. Debe evitar el consumo de alcohol en cualquiera de sus formas. El cigarrillo causa nacimientos prematuros y bebs de bajo peso. El uso de drogas recreativas est absolutamente prohibido. Son muy nocivas para el beb. Un beb que nace de una madre adicta, ser adicto al nacer. Ese beb tendr los mismos   sntomas de abstinencia que un adulto.  Infrmele al profesional si consume alguna droga.  No consuma drogas ilegales. Pueden causarle mucho dao al beb. SOLICITE ATENCIN MDICA SI: Tiene preguntas o preocupaciones durante su embarazo. Es mejor que llame para consultar las dudas que esperar hasta su prxima visita prenatal. De esta forma se sentir ms tranquila.  SOLICITE ATENCIN MDICA DE INMEDIATO SI:  La temperatura oral se eleva sin motivo por encima de 102 F (38.9 C) o segn le indique el profesional que lo asiste.  Tiene una prdida de lquido por la vagina (canal de parto). Si sospecha una ruptura de las membranas, tmese la temperatura y llame al profesional para informarlo sobre  esto.  Observa unas pequeas manchas, una hemorragia vaginal o elimina cogulos. Notifique al profesional acerca de la cantidad y de cuntos apsitos est utilizando. Unas pequeas manchas de sangre son algo comn durante el embarazo, especialmente despus de mantener relaciones sexuales.  Presenta un olor desagradable en la secrecin vaginal y observa un cambio en el color, de transparente a blanco.  Contina con las nuseas y no obtiene alivio de los remedios indicados. Vomita sangre o algo similar a la borra del caf.  [place] o sube ms de 900 g. en una semana, o segn lo indicado por el profesional que la asiste.  Observa que se le hinchan el rostro, las manos, los pies o las piernas.  Ha estado expuesta a la rubola y no ha sufrido la enfermedad.  Ha estado expuesta a la quinta enfermedad o a la varicela.  Presenta dolor abdominal. Las molestias en el ligamento redondo son una causa no cancerosa (benigna) frecuente de dolor abdominal durante el embarazo. El profesional que la asiste deber evaluarla.  Presenta dolor de cabeza intenso que no se alivia.  Presenta fiebre, diarrea, dolor al orinar o le falta la respiracin.  Presenta dificultad para ver, visin borrosa, o visin doble.  Sufre una cada, un accidente de trnsito o cualquier tipo de trauma.  Vive en un hogar en el que existe violencia fsica o mental. Document Released: 04/22/2005 Document Revised: 10/05/2011 [employer] Patient Information 2013 [employer].  

## 2012-10-06 NOTE — Progress Notes (Signed)
Well, no c/o, follow up ultrasound scheduled. Rev'd precautions.

## 2012-10-06 NOTE — Progress Notes (Signed)
Ultraound scheduled for 10/12/12 at 8:15 am.

## 2012-10-12 ENCOUNTER — Ambulatory Visit (HOSPITAL_COMMUNITY)
Admission: RE | Admit: 2012-10-12 | Discharge: 2012-10-12 | Disposition: A | Discharge status: Home / Self Care | Payer: Self-pay | Source: Ambulatory Visit | Attending: Advanced Practice Midwife | Admitting: Advanced Practice Midwife

## 2012-10-12 ENCOUNTER — Other Ambulatory Visit: Payer: Self-pay | Admitting: Advanced Practice Midwife

## 2012-10-12 DIAGNOSIS — O34219 Maternal care for unspecified type scar from previous cesarean delivery: Secondary | ICD-10-CM | POA: Insufficient documentation

## 2012-10-12 DIAGNOSIS — Z3689 Encounter for other specified antenatal screening: Secondary | ICD-10-CM | POA: Insufficient documentation

## 2012-10-12 DIAGNOSIS — Z3490 Encounter for supervision of normal pregnancy, unspecified, unspecified trimester: Secondary | ICD-10-CM

## 2012-10-12 DIAGNOSIS — O09299 Supervision of pregnancy with other poor reproductive or obstetric history, unspecified trimester: Secondary | ICD-10-CM | POA: Insufficient documentation

## 2012-10-20 ENCOUNTER — Encounter: Payer: Self-pay | Admitting: Advanced Practice Midwife

## 2012-10-20 ENCOUNTER — Other Ambulatory Visit: Payer: Self-pay | Admitting: Advanced Practice Midwife

## 2012-10-20 ENCOUNTER — Ambulatory Visit (INDEPENDENT_AMBULATORY_CARE_PROVIDER_SITE_OTHER): Payer: Self-pay | Admitting: Advanced Practice Midwife

## 2012-10-20 DIAGNOSIS — R7309 Other abnormal glucose: Secondary | ICD-10-CM

## 2012-10-20 DIAGNOSIS — Z8759 Personal history of other complications of pregnancy, childbirth and the puerperium: Secondary | ICD-10-CM | POA: Insufficient documentation

## 2012-10-20 DIAGNOSIS — O34219 Maternal care for unspecified type scar from previous cesarean delivery: Secondary | ICD-10-CM

## 2012-10-20 DIAGNOSIS — O09293 Supervision of pregnancy with other poor reproductive or obstetric history, third trimester: Secondary | ICD-10-CM

## 2012-10-20 DIAGNOSIS — O09299 Supervision of pregnancy with other poor reproductive or obstetric history, unspecified trimester: Secondary | ICD-10-CM

## 2012-10-20 LAB — POCT URINALYSIS DIP (DEVICE)
Bilirubin Urine: NEGATIVE
Glucose, UA: NEGATIVE mg/dL
Ketones, ur: NEGATIVE mg/dL
Specific Gravity, Urine: 1.02 (ref 1.005–1.030)
Urobilinogen, UA: 0.2 mg/dL (ref 0.0–1.0)

## 2012-10-20 NOTE — Patient Instructions (Addendum)
Parto vaginal luego de una cesrea (Vaginal Birth After Cesarean Delivery) Un parto vaginal luego de un parto por cesrea es dar a luz por la vagina luego de haber dado a luz por medio de una intervencin quirrgica. En el pasado, si una mujer tena un beb por cesrea, todos los partos posteriores deban hacerse por cesrea. Esto ya no es as. Puede ser seguro para la mam intentar un parto vaginal luego de una cesrea. La decisin final de tener un parto vaginal o por cesrea debe tomarse en conjunto, entre la paciente y el mdico. Los riesgos y los beneficios debern evaluarse con relacin a los motivos y al tipo de cesrea previa LAS MUJERES QUE QUIEREN TENER UN PARTO VAGINAL, DEBEN CONSULTAR CON SU MDICO PARA ASEGURARSE QUE:  La cesrea anterior se realiz con una incisin uterina transversal (no con una incisin vertical clsica).  El canal de parto es lo suficientemente grande como para que pase el nio.  No ha sido sometida a otras operaciones del tero.  Durante el trabajo de parto le realizarn un monitoreo electrnico fetal, en todo momento.  Es necesario que haya un quirfano disponible y listo en caso de necesitar una cesrea de emergencia.  Un cirujano y personal de quirfano estarn disponibles en todo momento durante el trabajo de parto, para realizar una cesrea en caso de ser necesario.  Habr un anestesista disponible en caso de necesitar una cesrea de emergencia.  La nursery est lista cuenta con personal especializado y el equipo disponible para cuidar al beb en caso de emergencia. BENEFICIOS  Permanencia ms breve en el hospital.  Menores costos en el parto, la nurse y el hospital.  Menos prdida de sangre y menos probabilidad de necesitar una transfusin sangunea.  Menos probabilidad de tener fiebre o molestias como consecuencia de una ciruga mayor  Menos riesgo de cogulos sanguneos.  Menos riesgo de sufrir infecciones.  Recuperacin ms rpida luego  del alta mdica.  Menos riesgo de complicaciones quirrgicas, como apertura o hernia de la incisin.  Disminucin del riesgo de lesiones a otros rganos  Menor riesgo de remocin del tero (histerectoma)  Menor riesgo de que la placenta cubra parcial o completamente la abertura del tero (placenta previa) en embarazos futuros  Posibilidad de tener una familia grande, si lo desea. RIESGOS  Ruptura del tero.  Si el tero se rompe le realizarn una histerectoma.  Todas las complicaciones de una ciruga mayor y lesiones en otros rganos.  Hemorragia excesiva, cogulos e infeccin.  [place] Apgar Puntuacin Apgar baja (mtodo que evala al recin nacido segn su apariencia, pulso, muecas, actividad y respiracion) y ms riesgos para el beb.  Hay mas riesgo de ruptura del tero si se induce o aumenta el trabajo de parto.  Hay un mayor riesgo de ruptura uterina si se usan medicamentos para madurar el cuello. NO DEBE LLEVARSE A CABO SI:  La cesrea previa se realiz con una incicin vertical (clsica) o con forma de T, o usted no sabe cul de ellas le han practicado.  Ha sufrido ruptura del tero.  Le han practicado una ciruga de tero.  Tiene problemas mdicos u obsttricos.  El beb est en problemas.  Tuvo dos cesreas previas y ningn parto vaginal. OTRAS COSAS QUE DEBE SABER:  La anestesia peridural es segura.  Es seguro dar vuelta al beb si se encuentra de nalgas (intentar una versin ceflica externa).  Es seguro intentarlo en caso de mellizos.  Los embarazos de ms de 40 semanas no tienen   xito con este procedimiento.  Hay un aumento de fracasos en embarazadas obesas.  Hay un aumento del porcentaje de fracasos si el beb pesa 4 Kg o ms.  Hay aumento en el porcentaje de fracasos si el intervalo entre la operacin cesrea y el parto vaginal es de menos de 19 meses.  Hay un aumento en el porcentaje de fracasos si ha sufrido preeclampsia hipertensin arterial,  protenas en la orina e hinchazn del rostro y las extremidades.  El parto vaginal ser muy exitoso si tuvo un parto vaginal previo.  Tambin es exitoso en el caso que el trabajo de parto comience espontneamente antes de la fecha.  El parto vaginal luego de una cesrea es similar a un parto espontneo vaginal normal. Es importante que converse con su mdico desde comienzos del embarazo de modo que pueda comprender los riesgos, beneficios y opciones. De este modo tendr tiempo de decidir que es lo mejor en su caso particular en relacin a su parto por cesrea anterior. Hay que tener en cuenta que puede haber cambios en la madre durante el embarazo, lo que hace necesario cambiar su decisin o la del mdico. Los consejos, preocupaciones y decisiones debern documentarse en la historia clnica y debe ser firmada por todas las partes. Document Released: 12/30/2007 Document Revised: 10/05/2011 [employer] Patient Information 2013 [employer].  

## 2012-10-20 NOTE — Progress Notes (Signed)
Reviewed dating... Had first [place] at MD office at 4 mos.  Recent US is 3 weeks different. Will ROI copy from Dr Gaynell Face. Was told EDC was 6/22 on 08/06/12 (c/w LMP dates). Also discussed TOLAC.  Was told that the inside incision was vertical but was told she could have a TOLAC "because they checked me every 2 weeks for 3 months. Explained risks of TOLAC with vertical incision. Pt will try to obtain an op note from [place], to be sure.   Had 3 hr GTT at 20wks so will repeat glucola today. Will check [place] for growth in mid-April, per recommendation.

## 2012-10-20 NOTE — Progress Notes (Signed)
Pulse: 88

## 2012-10-21 DIAGNOSIS — R7309 Other abnormal glucose: Secondary | ICD-10-CM | POA: Insufficient documentation

## 2012-10-21 LAB — GLUCOSE TOLERANCE, 1 HOUR (50G) W/O FASTING: Glucose, 1 Hour GTT: 150 mg/dL — ABNORMAL HIGH (ref 70–140)

## 2012-10-24 ENCOUNTER — Telehealth: Payer: Self-pay | Admitting: *Deleted

## 2012-10-24 NOTE — Telephone Encounter (Signed)
Called Mystie with [employer] and notified her 1hr GTT was elevated, needs 3 hr GTT. Will come in 10/26/12 for 3hr GTt. Explained needs to be NPO after midnight.

## 2012-10-26 ENCOUNTER — Other Ambulatory Visit: Payer: Self-pay

## 2012-10-26 DIAGNOSIS — R7309 Other abnormal glucose: Secondary | ICD-10-CM

## 2012-10-27 LAB — GLUCOSE TOLERANCE, 3 HOURS
Glucose Tolerance, 1 hour: 174 mg/dL (ref 70–189)
Glucose Tolerance, 2 hour: 177 mg/dL — ABNORMAL HIGH (ref 70–164)
Glucose Tolerance, Fasting: 86 mg/dL (ref 70–104)
Glucose, GTT - 3 Hour: 142 mg/dL (ref 70–144)

## 2012-10-31 ENCOUNTER — Encounter: Payer: Self-pay | Admitting: Family Medicine

## 2012-11-03 ENCOUNTER — Ambulatory Visit (INDEPENDENT_AMBULATORY_CARE_PROVIDER_SITE_OTHER): Payer: Self-pay | Admitting: Obstetrics and Gynecology

## 2012-11-03 ENCOUNTER — Other Ambulatory Visit: Payer: Self-pay | Admitting: Obstetrics and Gynecology

## 2012-11-03 ENCOUNTER — Encounter: Payer: Self-pay | Admitting: Obstetrics and Gynecology

## 2012-11-03 VITALS — BP 113/68 | Temp 97.7°F | Wt 171.3 lb

## 2012-11-03 DIAGNOSIS — Z8632 Personal history of gestational diabetes: Secondary | ICD-10-CM | POA: Insufficient documentation

## 2012-11-03 DIAGNOSIS — O09299 Supervision of pregnancy with other poor reproductive or obstetric history, unspecified trimester: Secondary | ICD-10-CM

## 2012-11-03 DIAGNOSIS — O34219 Maternal care for unspecified type scar from previous cesarean delivery: Secondary | ICD-10-CM

## 2012-11-03 DIAGNOSIS — O24419 Gestational diabetes mellitus in pregnancy, unspecified control: Secondary | ICD-10-CM

## 2012-11-03 DIAGNOSIS — O99810 Abnormal glucose complicating pregnancy: Secondary | ICD-10-CM

## 2012-11-03 DIAGNOSIS — O0992 Supervision of high risk pregnancy, unspecified, second trimester: Secondary | ICD-10-CM

## 2012-11-03 DIAGNOSIS — R7309 Other abnormal glucose: Secondary | ICD-10-CM

## 2012-11-03 LAB — POCT URINALYSIS DIP (DEVICE)
Nitrite: POSITIVE — AB
Protein, ur: NEGATIVE mg/dL
Specific Gravity, Urine: 1.02 (ref 1.005–1.030)
Urobilinogen, UA: 0.2 mg/dL (ref 0.0–1.0)

## 2012-11-03 MED ORDER — CEPHALEXIN 500 MG PO CAPS: 500.0000 mg | ORAL_CAPSULE | Freq: Four times a day (QID) | ORAL | Status: DC

## 2012-11-03 NOTE — Progress Notes (Signed)
Nutrition note: 1st visit Pt seen for GDM diet education. Pt has gained 17.3# @ [redacted]w[redacted]d, which is wnl. Pt reports eating 3 meals & 1 snack/d. Pt reports drinking a [employer] life shake for breakfast. Pt is taking PNV. Pt reports walking daily. Pt reports no N&V or heartburn. Pt received verbal & written GDM education. Encouraged pt to talk with PCP about safety of Herba life during pregnancy. Disc wt gain goals of 15-25# or 0.6#/wk.  Pt agrees to follow GDM diet including 3 meals & 3 snacks with proper CHO/ protein combination. Pt has WIC and plans to BF. Pt to see Seward Grater, CDE on Monday for BS meter. F/u in 2-4 wks Blondell Reveal, MS, RD, LDN

## 2012-11-03 NOTE — Progress Notes (Signed)
Patient doing well. Patient informed of abnormal 3hr and diagnosis of gestational diabetes. Explained diagnosis and its impact on fetus if not well controlled. Patient with positive nitrite on UA- culture ordered and Rx Keflex provided. Nutrition consult today

## 2012-11-03 NOTE — Progress Notes (Signed)
Pulse- 87 

## 2012-11-06 LAB — CULTURE, OB URINE

## 2012-11-07 ENCOUNTER — Ambulatory Visit (INDEPENDENT_AMBULATORY_CARE_PROVIDER_SITE_OTHER): Payer: Self-pay | Admitting: Obstetrics & Gynecology

## 2012-11-07 ENCOUNTER — Encounter: Payer: Self-pay | Admitting: *Deleted

## 2012-11-07 DIAGNOSIS — O99810 Abnormal glucose complicating pregnancy: Secondary | ICD-10-CM

## 2012-11-07 DIAGNOSIS — O24419 Gestational diabetes mellitus in pregnancy, unspecified control: Secondary | ICD-10-CM

## 2012-11-07 NOTE — Progress Notes (Signed)
11/07/2012:  Diabetes Education: With the assistance of Alex the Spanish interpreter, completed review of the factors affecting blood glucose; hypoglycemia and it's symptoms and treatment; exercise and the use of walking., and blood glucose monitoring.  Provided a True Track meter kit and 1 box of strips and 1 box of lancets.  On completing a return demonstration, her glucose at 12:00 Noon was 174 mg/dl.  Reports drinking regular juice for breakfast along with a sausage sandwich.  Review the need to not use the regular juice, but to drink water or milk at breakfast and have juice after 10:00 AM.  Instructed to bring glucose meter and log book to all clinic appointments.  Maggie Ermagene Saidi, RN, RD, CDE

## 2012-11-08 ENCOUNTER — Encounter: Payer: Self-pay | Attending: Family Medicine | Admitting: Dietician

## 2012-11-08 DIAGNOSIS — Z713 Dietary counseling and surveillance: Secondary | ICD-10-CM | POA: Insufficient documentation

## 2012-11-08 DIAGNOSIS — O99810 Abnormal glucose complicating pregnancy: Secondary | ICD-10-CM | POA: Insufficient documentation

## 2012-11-09 ENCOUNTER — Ambulatory Visit (HOSPITAL_COMMUNITY)
Admission: RE | Admit: 2012-11-09 | Discharge: 2012-11-09 | Disposition: A | Discharge status: Home / Self Care | Payer: Self-pay | Source: Ambulatory Visit | Attending: Advanced Practice Midwife | Admitting: Advanced Practice Midwife

## 2012-11-09 DIAGNOSIS — O34219 Maternal care for unspecified type scar from previous cesarean delivery: Secondary | ICD-10-CM | POA: Insufficient documentation

## 2012-11-09 DIAGNOSIS — O09299 Supervision of pregnancy with other poor reproductive or obstetric history, unspecified trimester: Secondary | ICD-10-CM | POA: Insufficient documentation

## 2012-11-09 DIAGNOSIS — O09293 Supervision of pregnancy with other poor reproductive or obstetric history, third trimester: Secondary | ICD-10-CM

## 2012-11-14 ENCOUNTER — Encounter: Payer: Self-pay | Admitting: Obstetrics & Gynecology

## 2012-11-14 NOTE — Patient Instructions (Signed)
Return to clinic for any obstetric concerns or go to [facility] for evaluation  

## 2012-11-14 NOTE — Progress Notes (Signed)
Patient saw Alexis Goodell, CDE only. Please refer to her note.

## 2012-11-17 ENCOUNTER — Ambulatory Visit (INDEPENDENT_AMBULATORY_CARE_PROVIDER_SITE_OTHER): Payer: Self-pay | Admitting: Family Medicine

## 2012-11-17 VITALS — BP 116/69 | Temp 97.0°F | Wt 172.5 lb

## 2012-11-17 DIAGNOSIS — O99810 Abnormal glucose complicating pregnancy: Secondary | ICD-10-CM

## 2012-11-17 DIAGNOSIS — O09299 Supervision of pregnancy with other poor reproductive or obstetric history, unspecified trimester: Secondary | ICD-10-CM

## 2012-11-17 DIAGNOSIS — O24419 Gestational diabetes mellitus in pregnancy, unspecified control: Secondary | ICD-10-CM

## 2012-11-17 DIAGNOSIS — O0993 Supervision of high risk pregnancy, unspecified, third trimester: Secondary | ICD-10-CM

## 2012-11-17 DIAGNOSIS — O34219 Maternal care for unspecified type scar from previous cesarean delivery: Secondary | ICD-10-CM

## 2012-11-17 NOTE — Progress Notes (Signed)
Pulse: 92

## 2012-11-17 NOTE — Patient Instructions (Addendum)
Diabetes mellitus gestacional (Gestational Diabetes Mellitus) La diabetes mellitus gestacional se produce slo durante el embarazo. Aparece cuando el organismo no puede controlar adecuadamente la glucosa (azcar) que aumenta en la sangre despus de comer. Durante el [place], se produce una resistencia a la insulina (sensibilidad reducida a la insulina) debido a la liberacin de hormonas por parte de la placenta. Generalmente, el pncreas de una mujer embarazada produce la cantidad suficiente de insulina para vencer esa resistencia. Sin embargo, en la diabetes gestacional, hay insulina pero no cumple su funcin adecuadamente. Si la resistencia es lo suficientemente grave como para que el pncreas no produzca la cantidad de insulina suficiente, la glucosa extra se acumula en la sangre.  Julie Allen RIESGO DE DESARROLLAR DIABETES GESTACIONAL?  Las mujeres con historia de diabetes en la familia.  Las mujeres de ms de [address]  Las que presentan sobrepeso.  Las [employer] pertenecen a ciertos grupos tnicos (latinas, afroamericanas, norteamericanas nativas, asiticas y las originarias de las islas del Pacfico. QUE PUEDE OCURRIRLE AL BEB? Si el nivel de glucosa en sangre de la madre es demasiado elevado mientras este [place], el nivel extra de azcar pasar por el cordn umbilical hacia el beb. Algunos de los problemas del beb pueden ser:  Beb demasiado grande: si el nio recibe Chief Strategy Officer, puede aumentar mucho de [place]. Esto puede hacer que sea demasiado grande para nacer por parto normal (vaginal) por lo que ser necesario realizar una cesrea.  Bajo nivel de glucosa (hipoglucemia): el beb produce insulina extra en respuesta a la excesiva cantidad de azcar que obtiene de [employer]. Cuando el beb nace y ya no necesita insulina extra, su nivel de azcar en sangre puede disminuir.  Ictericia (coloracin amarillenta de la piel y los ojos): esto es bastante frecuente en los bebs. La  causa es la acumulacin de una sustancia qumica denominada bilirrubina. No siempre es un trastorno grave, pero se observa con frecuencia en los bebs cuyas madres sufren diabetes gestacional. RIESGOS PARA LA MADRE Las mujeres que han sufrido diabetes gestacional pueden tener ms riesgos para algunos problemas como:  Preeclampsia o toxemia, incluyendo problemas con hipertensin arterial. La presin arterial y los niveles de protenas en la orina deben controlarse con frecuencia.  Infecciones  Parto por cesrea.  Aparicin de diabetes tipo 2 en una etapa posterior de la vida. Alrededor del 30% al 50% sufrir diabetes posteriormente, especialmente las que son obesas. DIAGNSTICO Las hormonas que causan resistencia a la insulina tienen su mayor nivel alrededor de las 24 a 28 semanas del Psychiatrist. Si se experimentan sntomas, stos son similares a los sntomas que normalmente aparecen durante el embarazo.  La diabetes mellitus gestacional generalmente se diagnostica por medio de un mtodo en dos partes: 1. Despus de la 24 a 28 semanas de Psychiatrist, la mujer debe beber una solucin que contiene glucosa y Education officer, environmental un anlisis de [place]. Si el nivel de glucosa es elevado, la realizarn un segundo [place]. 2. La prueba oral de tolerancia a la glucosa, que dura aproximadamente tres horas. Despus de realizar ayuno durante la noche, se controla nivel de glucosa en sangre. La mujer bebe una solucin que contiene glucosa y Chief Executive Officer realizan anlisis de glucosa en sangre cada hora. Si la mujer tiene factores de riesgos para la diabetes mellitus gestacional, el mdico podr Programme researcher, broadcasting/film/video anlisis antes de las 24 semanas de [place]. TRATAMIENTO El tratamiento est dirigido a Insurance underwriter en sangre de la madre en un nivel normal y puede incluir:  La  planificacin de los alimentos.  Recibir insulina u otro medicamento para Sales executive nivel de glucosa en [place].  La prctica de ejercicios.  Llevar un  registro diario de los alimentos que consume.  Control y Engineer, maintenance (IT) de los niveles de glucosa en [place].  Control de los niveles de cetona en la [place], [place] esto ya no se considera necesario en la mayora de los [place]. INSTRUCCIONES PARA EL CUIDADO DOMICILIARIO Mientras est embarazada:  Siga los consejos de su mdico relacionados con los controles prenatales, la planificacin de la comida, la actividad fsica, los [place], vitaminas, los anlisis de sangre y otras pruebas y las actividades fsicas.  Lleve un registro de las comidas, las pruebas de glucosa en sangre y la cantidad de insulina que recibe (si corresponde). Muestre todo al profesional en cada consulta mdica prenatal.  Si sufre diabetes mellitus gestacional, podr tener problemas de hipoglucemia (nivel bajo de glucosa en sangre). Podr sospechar este problema si se siente repentinamente mareada, tiene temblores y/o se siente dbil. Si cree que esto le est ocurriendo, y tiene un medidor de glucosa, mida su nivel de Event organiser. Siga los consejos de su mdico sobre el modo y el momento de tratar su nivel de glucosa en sangre. Generalmente se sigue la regla 15:15 Consuma 15 g de hidratos de carbono, espere 15 minutos y Programmer, systems el nivel de glucosa en [place].Julie Allen de 15 g de hidratos de carbono son:  1 taza de [employer].   taza de jugo.  3-4 tabletas de glucosa.  5-6 caramelos duros.  1 caja pequea de pasas de uva.   taza de gaseosa comn.  Mantenga una buena higiene para evitar infecciones.  No fume. SOLICITE ATENCIN MDICA SI:  Observa prdida vaginal con o sin picazn.  Se siente ms dbil o cansada que lo habitual.  Primus [place].  Tiene un aumento de peso repentino, 2,5 kg o ms en una semana.  Pierde peso, 1.5 kg o ms en una semana.  Su nivel de glucosa en sangre es elevado, necesita instrucciones. SOLICITE ATENCIN MDICA DE INMEDIATO SI:  Sufre una cefalea  intensa.  Se marea o pierde el conocimiento  Presenta nuseas o vmitos.  Se siente desorientada confundida.  Sufre convulsiones.  Tiene problemas de visin.  Siente Physiological scientist.  Presenta una hemorragia vaginal abundante.  Tiene contracciones uterinas.  Tiene una prdida importante de lquido por la vagina DESPUS QUE NACE EL BEB:  Concurra a todos los controles de seguimiento y Clinical biochemist los anlisis de sangre segn las indicaciones de su mdico.  Mantenga un estilo de vida saludable para evitar la diabetes en el futuro. Aqu se incluye:  Siga el plan de alimentacin saludable.  Controle su peso.  Practique actividad fsica y descanse lo necesario.  No fume.  Amamante a su beb mientras pueda. Esto disminuir la probabilidad de que usted y su beb sufran diabetes posteriormente. Para ms informacin acerca de la diabetes, visite la pgina web de Holiday representative Diabetes Association: [email or web address].cy. Para ms informacin acerca de la diabetes gestacional cite la pgina web del [employer] of Obstetricians and Gynecologists en: [email or web address]. Document Released: 04/22/2005 Document Revised: 10/05/2011 [facility] Patient Information 2013 [place], [place].  Embarazo  Systems analyst trimestre  (Pregnancy - Third Trimester) El tercer trimestre del Psychiatrist (los ltimos 3 meses) es el perodo en el cual tanto usted como su beb crecen con ms rapidez. El beb alcanza un largo de aproximadamente 50 cm. y pesa entre 2,700 y 4,500 kg. El  beb gana ms tejido graso y est listo para la vida fuera del cuerpo de la [place]. Mientras estn en el interior, los bebs tienen perodos de sueo y vigilia, Warehouse manager y tienen hipo. Quizs sienta pequeas contracciones del tero. Este es el falso trabajo de [place]. Tambin se las conoce como contracciones de [place] . Es como una prctica del parto. Los problemas ms habituales de esta etapa del embarazo incluyen  mayor dificultad para respirar, hinchazn de las manos y los pies por retencin de lquidos y la necesidad de Geographical information systems officer con ms frecuencia debido a que el tero y el beb presionan sobre la vejiga.  EXAMENES PRENATALES   Durante los [employer], deber seguir realizndose anlisis de [place]. Estas pruebas se realizan para controlar su salud y la del beb. Los [employer] de sangre se Radiographer, therapeutic para [employer] niveles de algunos compuestos de la sangre (hemoglobina). La anemia (bajo nivel de hemoglobina) es frecuente durante el embarazo. Para prevenirla, se administran hierro y vitaminas. Tambin le tomarn nuevas anlisis para descartar diabetes. Podrn repetirle algunas de las [employer] Enterprises hicieron previamente.  En cada visita le medirn el tamao del tero. Esto permite asegurar que el beb se desarrolla adecuadamente, segn la fecha del embarazo.  Le controlarn la presin arterial en cada visita prenatal. Esto es para asegurarse de que no sufre toxemia.  Le harn un anlisis de orina en cada visita prenatal, para descartar infecciones, diabetes y la presencia de protenas.  Tambin en cada visita controlarn su peso. Esto se realiza para asegurarse que aumenta de peso al ritmo indicado y que usted y su beb evolucionan normalmente.  En algunas ocasiones se realiza una prueba de ultrasonido para confirmar el correcto desarrollo y evolucin del beb. Esta prueba se realiza con ondas sonoras inofensivas para el beb, de modo que el profesional pueda calcular ms precisamente la fecha del [place].  Analice con su mdico los analgsicos y la anestesia que recibir durante el [place] de parto y [place].  Comente la posibilidad de que necesite una cesrea y qu anestesia se recibir.  Informe a su mdico si sufre violencia familiar mental o fsica. A veces, se indica la prueba especializada sin estrs, la prueba de tolerancia a las contracciones y el perfil biofsico para asegurarse de que el  beb no tiene problemas. El estudio del lquido amnitico que rodea al beb se llama amniocentesis. El lquido amnitico se obtiene introduciendo una aguja en el vientre (abdomen ). En ocasiones se lleva a cabo cerca del final del embarazo, si es necesario inducir a un parto. En este caso se realiza para asegurarse que los pulmones del beb estn lo suficientemente maduros como para que pueda vivir fuera del tero. Si los pulmones no han madurado y es peligroso que el beb nazca, se Building services engineer a la madre una inyeccin de [place] , 1 a 2 [address]  [address] antes del [address]. Julie Allen ayuda a que los pulmones del beb maduren y sea ms seguro su nacimiento.  CAMBIOS QUE OCURREN EN EL TERCER TRIMESTRE DEL EMBARAZO  Su organismo atravesar numerosos cambios durante el [place]. Estos pueden variar de Neomia Dear persona a otra. [place]verse con el profesional que la asiste acerca los cambios que usted note y que la preocupen.   Durante el ltimo trimestre probablemente sienta un aumento del apetito. Es normal tener "antojos" de Development worker, community. Esto vara de Neomia Dear persona a otra y de un embarazo a Therapist, art.  Podrn aparecer las primeras estras en las caderas, abdomen y  mamas. Estos son cambios normales del cuerpo durante el [place]. No existen medicamentos ni ejercicios que puedan prevenir [employer].  La constipacin puede tratarse con un laxante o agregando fibra a su dieta. Beber grandes cantidades de lquidos, tomar fibras en forma de vegetales, frutas y granos integrales es de gran [place].  Tambin es beneficioso practicar actividad fsica. Si ha sido una persona Engineer, mining, podr continuar con la [employer]son de las actividades durante el mismo. Si ha sido [employer], puede ser beneficioso que comience con un programa de ejercicios, Museum/gallery exhibitions officer. Consulte con el profesional que la asiste antes de comenzar un programa de ejercicios.  Evite el consumo de cigarrillos, el alcohol, los medicamentos no recetados y  las "drogas de la calle" durante el Psychiatrist. Estas sustancias qumicas afectan la formacin y el desarrollo del beb. Evite estas sustancias durante todo el embarazo para asegurar el nacimiento de un beb sano.  Podr sentir dolor de espalda, tener vrices en las venas y hemorroides, o si ya los sufra, pueden [place].  Durante el tercer trimestre se cansar con ms facilidad, lo cual es normal.  Los movimientos del beb pueden ser ms fuertes y con ms frecuencia.  Puede que note dificultades para respirar normalmente.  El ombligo puede salir hacia afuera.  A veces sale Veterinary surgeon de las [place], que se llama Product manager.  Podr aparecer Neomia Dear secrecin mucosa con sangre. Esto suele ocurrir General Electric unos [address] y Neomia Dear semana antes del [place]. INSTRUCCIONES PARA EL CUIDADO EN EL HOGAR   Cumpla con las citas de control. Siga las indicaciones del mdico con respecto al uso de [place], los ejercicios y la dieta.  Durante el embarazo debe obtener nutrientes para usted y para su beb. Consuma alimentos balanceados a intervalos regulares. Elija alimentos como carne, pescado, [place] y otros productos lcteos descremados, vegetales, frutas, panes integrales y cereales. El [employer] informar cul es el aumento de peso ideal.  Las relaciones sexuales pueden continuarse hasta casi el final del embarazo, si no se presentan otros problemas como prdida prematura (antes de [place]) de lquido amnitico, hemorragia vaginal o dolor en el vientre (abdominal).  Realice Tesoro Corporation, si no tiene restricciones. Consulte con el profesional que la asiste si no sabe con certeza si determinados ejercicios son seguros. El mayor aumento de peso se producir en los ltimos 2 trimestres del Psychiatrist. El ejercicio ayuda a:  Engineering geologist.  Mantenerse en forma para el trabajo de parto y [place] .  Perder peso despus del parto.  Haga reposo con frecuencia, con las piernas elevadas,  o segn lo necesite para evitar los calambres y el dolor de cintura.  Use un buen sostn o como los que se usan para hacer deportes para Paramedic la sensibilidad de las [place]. Tambin puede serle til si lo [place] mientras duerme. Si pierde Product manager, podr [employer]in.  No utilice la baera con agua caliente, baos turcos y saunas.  Colquese el cinturn de seguridad cuando conduzca. Este la proteger a usted y al beb en caso de accidente.  Evite comer carne cruda y el contacto con los utensilios y desperdicios de los gatos. Estos elementos contienen grmenes que pueden causar defectos de nacimiento en el beb.  Es fcil perder algo de orina durante el [place]. Apretar y Chief Operating Officer los msculos de la pelvis la ayudar con este problema. Practique detener la miccin cuando est en el bao. Estos son los Cardinal Health  necesita fortalecer. Son [employer]PP[employer] Partners mismos msculos que utiliza cuando trata de evitar despedir gases. Puede practicar apretando estos msculos WellPoint, y repetir esto tres veces por da aproximadamente. Una vez que conozca qu msculos debe apretar, no realice estos ejercicios durante la miccin. Puede favorecerle una infeccin si la orina vuelve hacia atrs.  Pida ayuda si tienen necesidades financieras, teraputicas o nutricionales. El profesional podr ayudarla con respecto a estas necesidades, o derivarla a otros especialistas.  Haga una lista de nmeros telefnicos de emergencia y tngalos disponibles.  Planifique como obtener ayuda de familiares o amigos cuando regrese a Programmer, applications hospital.  Hacer un ensayo sobre la partida al hospital.  [place] clases prenatales con el padre para entender, practicar y hacer preguntas sobre el [place] de parto y el alumbramiento.  Preparar la habitacin del beb / busque Fatima Blank.  No viaje fuera de la ciudad a menos que sea absolutamente necesario y con el asesoramiento de su mdico.  Use slo zapatos de  tacn bajo o sin tacn para tener mejor equilibrio y Automotive engineer cadas. USO DE MEDICAMENTOS Y CONSUMO DE DROGAS DURANTE EL [facility]   Tome las vitaminas apropiadas para esta etapa tal como se le indic. Las vitaminas deben contener un miligramo de cido flico. Guarde todas las vitaminas fuera del alcance de los nios. La ingestin de slo un par de vitaminas o tabletas que contengan hierro pueden ocasionar la [employer] Mining en un beb o en un nio pequeo.  Evite el uso de [employer] Mutual [employer] Omaha, incluyendo hierbas, medicamentos de [place], sin receta o que no hayan sido sugeridos por su mdico. Slo tome medicamentos de venta libre o medicamentos recetados para Chief Technology Officer, Environmental health practitioner o fiebre como lo indique su mdico. No tome aspirina, ibuprofeno (Motrin, Advil, Nuprin) o naproxeno (Aleve) excepto que su mdico se lo indique.  Infrmele al profesional si consume alguna droga.  El alcohol se relaciona con ciertos defectos congnitos. Incluye el sndrome de alcoholismo fetal. Debe evitar absolutamente el consumo de alcohol, en cualquier forma. El fumar produce baja tasa de natalidad y bebs prematuros.  Las drogas ilegales o de la calle son muy perjudiciales para el beb. Estn absolutamente prohibidas. Un beb que nace de [employer] Express, ser adicto al nacer. Ese beb tendr los mismos sntomas de abstinencia que un adulto. SOLICITE ATENCIN MDICA SI:  Tiene preguntas o preocupaciones relacionadas con el embarazo. Es mejor que llame para formular las preguntas si no puede esperar hasta la prxima visita, que sentirse preocupada por ellas.  DECISIONES ACERCA DE LA CIRCUNCISIN  Usted puede saber o no cul es el sexo de su beb. Si ya sabe que ser un varn, este es el momento de pensar acerca de la circuncisin. La circuncisin es la extirpacin del prepucio. Esta es la piel que cubre el extremo sensible del pene. No hay un motivo mdico que lo justifique. Generalmente la decisin se toma segn lo que  sea popular en ese momento, o segn creencias religiosas. Podr conversar estos temas con su mdico o con el pediatra.  SOLICITE ATENCIN MDICA DE INMEDIATO SI:   La temperatura oral le sube a ms de 102 F (38.9 C) o lo que su mdico le indique.  Tiene una prdida de lquido por la vagina (canal de parto). Si sospecha una ruptura de las [place], tmese la temperatura y llame al profesional para informarlo sobre esto.  Observa unas pequeas manchas, una hemorragia vaginal o elimina cogulos. Notifique al profesional acerca de la cantidad  y de cuntos apsitos est utilizando.  Presenta un olor desagradable en la secrecin vaginal y observa un cambio en el color, de transparente a blanco.  Ha vomitado durante ms de 24 horas.  Siente escalofros o le sube la fiebre.  Le falta el aire.  Siente ardor al Beatrix Shipper.  Baja o sube ms de 2 libras (900 g), o segn lo indicado por el profesional que la asiste.  Observa que sbitamente se le hinchan el rostro, las manos, los pies o las piernas.  Siente dolor en el vientre (abdominal). Las [employer]ul en el ligamento redondo son Neomia Dear causa benigna frecuente de dolor abdominal durante el embarazo. El profesional que la asiste deber evaluarla.  Presenta dolor de cabeza intenso que no se [place].  Tiene problemas visuales, visin doble o borrosa.  Si no siente los movimientos del beb durante ms de 1 hora. Si piensa que el beb no se mueve tanto como lo haca habitualmente, coma algo que Psychologist, clinical y Target Corporation lado izquierdo durante [place]. El beb debe moverse al menos 4  5 veces por hora. Comunquese inmediatamente si el beb se mueve menos que lo indicado.  Se cae, se ve involucrada en un accidente automovilstico o sufre algn tipo de traumatismo.  En su hogar hay violencia mental o fsica. Document Released: 04/22/2005 Document Revised: 01/12/2012 [facility] Patient Information 2013 [place], [place].   Informacin sobre la  prueba de [place] de parto despus de Neomia Dear cesrea  (Trial of Labor After Cesarean Information) La prueba de [place] de parto despus de una cesrea (TOLAC por sus siglas en ingls) es cuando una mujer trata de dar a luz por va vaginal despus de una cesrea anterior. Cuando tiene [place], se denomina parto vaginal despus de una cesrea. La TOLAC puede ser Neomia Dear opcin segura y [place] segn la historia clnica y otros factores de [place].  Las posibilidades de un parto vaginal exitoso dependen de la razn por la que ya ha tenido una cesrea. Las mujeres tienen mayores tasas de xito si la cesrea se debi a:   Un parto de nalgas (presentacin fetal anmala).  Razones de Associate Professor.  Factores mdicos como la hipertensin. Las mujeres tienen menores tasas de xito si la cesrea se IT trainer debido a que:   No tenan dilatacin.  No pudo empujar al beb hacia fuera. Hable con su mdico sobre los beneficios, riesgos y porcentajes de [place]. Discuta sus planes de tener ms hijos. Una vez hecho esto, usted y su mdico pueden decidir si se debe intentar el TOLAC.  CANDIDATAS A TENER MS XITO  Este procedimiento es posible que algunas mujeres que:   Tienen una incisin horizontal (transversal baja) debido a una cesrea anterior.  Estn esperando gemelos y tuvieron una incisin transversal baja durante una cesrea.  El beb no es [address] grande (con [place]).  No tienen una cicatriz uterina vertical (clsica).  Estn en Aleen Campi de parto y tienen menos de 41 semanas de Psychiatrist. El TOLAC tambin puede intentarse en mujeres que cumplen con los criterios apropiados:   Son menores de [address].  Son altas y tienen un ndice de masa corporal [facility]) inferior a 30.  Probablemente darn a luz a un beb de peso promedio o menor que el peso promedio de nacimiento.  Tienen una cicatriz uterina desconocida.  El parto se Education officer, environmental en un hospital con un equipo mdico adecuado. Este equipo debe ser capaz de  Company secretary las posibles complicaciones, como una ruptura uterina.  Tienen asesoramiento completo sobre  los beneficios y riesgos del TOLAC.  Han comentado acerca de futuros planes de embarazo con su mdico.  Han planificado tener varios embarazos ms. TOLAC puede ser ms apropiado para mujeres que cumplen con las normas anteriores y que planean tener ms embarazos. No se recomienda en partos domiciliarios.  CANDIDATAS A TENER MENOS XITO  Es menos exitoso en mujeres que:   Tienen un parto inducido con un cuello uterino desfavorable. Un cuello uterino desfavorable es cuando no se dilata suficientemente (entre otros factores).  Nunca han tenido un parto vaginal.  Tuvieron una cesrea anterior por fracaso del progreso del [place].  Tuvieron una cesrea anterior debido a patrones anormales de frecuencia cardaca (trazado poco confiable de la frecuencia cardaca fetal).  Tuvieron un beb macrosmico.  Son postrmino. Esto significa que el embarazo ha durado ms de 42 semanas a partir del Social worker del ltimo perodo menstrual. No existen estudios de alta calidad que comparen los riesgos y beneficios de los partos por TOLAC y Comptroller repetida.  BENEFICIOS SUGERIDOS DEL TOLAC  Los beneficios son:   Wilburt Finlay un tiempo de recuperacin ms rpido.  Sufrir menos dolor que con Neomia Dear cesrea.  La pareja est involucrada en el proceso de parto. RIESGOS SUGERIDOS DEL TOLAC  El riesgo ms alto de complicaciones ocurre en mujeres que intentan un TOLAC y fracasan. Un TOLAC que fracasa resulta en una cesrea no planificada. Los riesgos relacionados con TOLAC o cesreas repetidas son:   Lulu Riding.  Infeccin.  Cogulos sanguneos.  Lesiones en los rganos o tejidos circundantes.  Extirpacin del tero (histerectoma).  Posibles problemas con la placenta (placenta previa, placenta acreta) en embarazos futuros.  Aunque es muy raro, las preocupaciones principales son:   Ruptura de la  cicatriz uterina de una cesrea anterior.  Necesidad de una cesrea de Associate Professor.  Mal resultado para el beb (morbilidad perinatal).  Tener embarazos muy seguidos (menos de 6 meses de diferencia). VENTAJAS DE REPETIR EL PARTO POR CESREA   Es conveniente programar un parto por cesrea.  Una mujer puede fcilmente someterse a una operacin para Building control surveyor futuros (esterilizacin) durante una cesrea, si lo desea.  Hay una menor tasa de histerectomas en el futuro debido a que cada del tero (relajacin plvica).  Los riesgos asociados al TOLAC no son aplicables. Irven Shelling MS INFORMACIN  [employer] ([employer]): www.acog.org  [employer] ([employer] [facility]): www.midwife.org  Document Released: 07/02/2011 Document Revised: 10/05/2011 [facility] Patient Information 2013 [place], [place].

## 2012-11-17 NOTE — Progress Notes (Signed)
GDM: fasting  45-126, all but two out of range, improved towards end of week. PP:  88-191 - 6 or 7 out of range of 28. Change diet at night - protein vs carbs. Drinking water regularly. Walking. Continue diet control for one week and recheck sugars next visit. Hx IUFD at 28 weeks. Growth sono 4/16 showed 16% efw (up from 13%). Start antenatal testing.  Hx prior c/s at 37 weeks in [place]. Pt states internal incision vertical (external is vertical) but was told she could have a TOLAC by her doctor in [place]. Desires TOLAC. Discussed risks again today and gave information to read - sign next visit.

## 2012-11-24 ENCOUNTER — Other Ambulatory Visit: Payer: Self-pay | Admitting: Family Medicine

## 2012-11-24 ENCOUNTER — Ambulatory Visit (INDEPENDENT_AMBULATORY_CARE_PROVIDER_SITE_OTHER): Payer: Self-pay | Admitting: Family Medicine

## 2012-11-24 VITALS — BP 111/69 | Temp 99.3°F | Wt 172.7 lb

## 2012-11-24 DIAGNOSIS — Z23 Encounter for immunization: Secondary | ICD-10-CM

## 2012-11-24 DIAGNOSIS — O09299 Supervision of pregnancy with other poor reproductive or obstetric history, unspecified trimester: Secondary | ICD-10-CM

## 2012-11-24 DIAGNOSIS — O24419 Gestational diabetes mellitus in pregnancy, unspecified control: Secondary | ICD-10-CM

## 2012-11-24 DIAGNOSIS — O99810 Abnormal glucose complicating pregnancy: Secondary | ICD-10-CM

## 2012-11-24 LAB — CBC
HCT: 30.2 % — ABNORMAL LOW (ref 36.0–46.0)
Hemoglobin: 9.7 g/dL — ABNORMAL LOW (ref 12.0–15.0)
MCH: 23.7 pg — ABNORMAL LOW (ref 26.0–34.0)
MCHC: 32.1 g/dL (ref 30.0–36.0)

## 2012-11-24 LAB — POCT URINALYSIS DIP (DEVICE)
Bilirubin Urine: NEGATIVE
Hgb urine dipstick: NEGATIVE
Nitrite: NEGATIVE
Protein, ur: NEGATIVE mg/dL
pH: 7 (ref 5.0–8.0)

## 2012-11-24 LAB — RPR

## 2012-11-24 MED ORDER — TETANUS-DIPHTH-ACELL PERTUSSIS 5-2.5-18.5 LF-MCG/0.5 IM SUSP
0.5000 mL | Freq: Once | INTRAMUSCULAR | Status: AC
  Administered 2012-11-24: 0.5 mL via INTRAMUSCULAR

## 2012-11-24 NOTE — Progress Notes (Signed)
Fasting BS 82-100 (one out of range).  Post-breakfast:  89-120. Post-lunch: 115-123 (two out of range)115-145 (6/10 out of range). No change for now. HX fetal demise at 28 wks.  Start antenatal testing this week.

## 2012-11-24 NOTE — Progress Notes (Signed)
P=98, Used Engineer, maintenance (IT)

## 2012-11-24 NOTE — Patient Instructions (Signed)
Diabetes mellitus gestacional (Gestational Diabetes Mellitus) La diabetes mellitus gestacional se produce slo durante el embarazo. Aparece cuando el organismo no puede controlar adecuadamente la glucosa (azcar) que aumenta en la sangre despus de comer. Durante el embarazo, se produce una resistencia a la insulina (sensibilidad reducida a la insulina) debido a la liberacin de hormonas por parte de la placenta. Generalmente, el pncreas de una mujer embarazada produce la cantidad suficiente de insulina para vencer esa resistencia. Sin embargo, en la diabetes gestacional, hay insulina pero no cumple su funcin adecuadamente. Si la resistencia es lo suficientemente grave como para que el pncreas no produzca la cantidad de insulina suficiente, la glucosa extra se acumula en la sangre.  QUINES TIENEN RIESGO DE DESARROLLAR DIABETES GESTACIONAL?  Las mujeres con historia de diabetes en la familia.  Las mujeres de ms de 25 aos.  Las que presentan sobrepeso.  Las mujeres que pertenecen a ciertos grupos tnicos (latinas, afroamericanas, norteamericanas nativas, asiticas y las originarias de las islas del [employer]. QUE PUEDE OCURRIRLE AL BEB? Si el nivel de glucosa en sangre de la madre es demasiado elevado mientras este embarazada, el nivel extra de azcar pasar por el cordn umbilical hacia el beb. Algunos de los problemas del beb pueden ser:  Beb demasiado grande: si el nio recibe demasiada azcar, puede aumentar mucho de peso. Esto puede hacer que sea demasiado grande para nacer por parto normal (vaginal) por lo que ser necesario realizar una cesrea.  Bajo nivel de glucosa (hipoglucemia): el beb produce insulina extra en respuesta a la excesiva cantidad de azcar que obtiene de la madre. Cuando el beb nace y ya no necesita insulina extra, su nivel de azcar en sangre puede disminuir.  Ictericia (coloracin amarillenta de la piel y los ojos): esto es bastante frecuente en los bebs. La  causa es la acumulacin de una sustancia qumica denominada bilirrubina. No siempre es un trastorno grave, pero se observa con frecuencia en los bebs cuyas madres sufren diabetes gestacional. RIESGOS PARA LA MADRE Las mujeres que han sufrido diabetes gestacional pueden tener ms riesgos para algunos problemas como:  Preeclampsia o toxemia, incluyendo problemas con hipertensin arterial. La presin arterial y los niveles de protenas en la orina deben controlarse con frecuencia.  Infecciones  Parto por cesrea.  Aparicin de diabetes tipo 2 en una etapa posterior de la vida. Alrededor del 30% al 50% sufrir diabetes posteriormente, especialmente las que son obesas. DIAGNSTICO Las hormonas que causan resistencia a la insulina tienen su mayor nivel alrededor de las 24 a 28 semanas del embarazo. Si se experimentan sntomas, stos son similares a los sntomas que normalmente aparecen durante el embarazo.  La diabetes mellitus gestacional generalmente se diagnostica por medio de un mtodo en dos partes: 1. Despus de la 24 a 28 semanas de embarazo, la mujer debe beber una solucin que contiene glucosa y realizar un anlisis de sangre. Si el nivel de glucosa es elevado, la realizarn un segundo anlisis. 2. La prueba oral de tolerancia a la glucosa, que dura aproximadamente tres horas. Despus de realizar ayuno durante la noche, se controla nivel de glucosa en sangre. La mujer bebe una solucin que contiene glucosa y le realizan anlisis de glucosa en sangre cada hora. Si la mujer tiene factores de riesgos para la diabetes mellitus gestacional, el mdico podr indicar el anlisis antes de las 24 semanas de embarazo. TRATAMIENTO El tratamiento est dirigido a mantener la glucosa en sangre de la madre en un nivel normal y puede incluir:  La   planificacin de los alimentos.  Recibir insulina u otro medicamento para controlar el nivel de glucosa en sangre.  La prctica de ejercicios.  Llevar un  registro diario de los alimentos que consume.  Control y registro de los niveles de glucosa en sangre.  Control de los niveles de cetona en la orina, aunque esto ya no se considera necesario en la mayora de los embarazos. INSTRUCCIONES PARA EL CUIDADO DOMICILIARIO Mientras est embarazada:  Siga los consejos de su mdico relacionados con los controles prenatales, la planificacin de la comida, la actividad fsica, los medicamentos, vitaminas, los anlisis de sangre y otras pruebas y las actividades fsicas.  Lleve un registro de las comidas, las pruebas de glucosa en sangre y la cantidad de insulina que recibe (si corresponde). Muestre todo al profesional en cada consulta mdica prenatal.  Si sufre diabetes mellitus gestacional, podr tener problemas de hipoglucemia (nivel bajo de glucosa en sangre). Podr sospechar este problema si se siente repentinamente mareada, tiene temblores y/o se siente dbil. Si cree que esto le est ocurriendo, y tiene un medidor de glucosa, mida su nivel de glucosa en sangre. Siga los consejos de su mdico sobre el modo y el momento de tratar su nivel de glucosa en sangre. Generalmente se sigue la regla 15:15 Consuma 15 g de hidratos de carbono, espere 15 minutos y vuelva controlar el nivel de glucosa en sangre.. Ejemplos de 15 g de hidratos de carbono son:  1 taza de leche descremada.   taza de jugo.  3-4 tabletas de glucosa.  5-6 caramelos duros.  1 caja pequea de pasas de uva.   taza de gaseosa comn.  Mantenga una buena higiene para evitar infecciones.  No fume. SOLICITE ATENCIN MDICA SI:  Observa prdida vaginal con o sin picazn.  Se siente ms dbil o cansada que lo habitual.  Transpira mucho.  Tiene un aumento de peso repentino, 2,5 kg o ms en una semana.  Pierde peso, 1.5 kg o ms en una semana.  Su nivel de glucosa en sangre es elevado, necesita instrucciones. SOLICITE ATENCIN MDICA DE INMEDIATO SI:  Sufre una cefalea  intensa.  Se marea o pierde el conocimiento  Presenta nuseas o vmitos.  Se siente desorientada confundida.  Sufre convulsiones.  Tiene problemas de visin.  Siente dolor en el estmago.  Presenta una hemorragia vaginal abundante.  Tiene contracciones uterinas.  Tiene una prdida importante de lquido por la vagina DESPUS QUE NACE EL BEB:  Concurra a todos los controles de seguimiento y realice los anlisis de sangre segn las indicaciones de su mdico.  Mantenga un estilo de vida saludable para evitar la diabetes en el futuro. Aqu se incluye:  Siga el plan de alimentacin saludable.  Controle su peso.  Practique actividad fsica y descanse lo necesario.  No fume.  Amamante a su beb mientras pueda. Esto disminuir la probabilidad de que usted y su beb sufran diabetes posteriormente. Para ms informacin acerca de la diabetes, visite la pgina web de la American Diabetes Association: www.americandiabetesassociation.org. Para ms informacin acerca de la diabetes gestacional cite la pgina web del [employer] en: www.acog.org. Document Released: 04/22/2005 Document Revised: 10/05/2011 [employer] Patient Information 2013 [employer].  

## 2012-11-27 ENCOUNTER — Other Ambulatory Visit: Payer: Self-pay | Admitting: Family Medicine

## 2012-11-27 DIAGNOSIS — O99013 Anemia complicating pregnancy, third trimester: Secondary | ICD-10-CM | POA: Insufficient documentation

## 2012-11-27 MED ORDER — FERROUS SULFATE 325 (65 FE) MG PO TBEC: 325.0000 mg | DELAYED_RELEASE_TABLET | Freq: Two times a day (BID) | ORAL | Status: DC

## 2012-11-28 ENCOUNTER — Ambulatory Visit (INDEPENDENT_AMBULATORY_CARE_PROVIDER_SITE_OTHER): Payer: Self-pay | Admitting: *Deleted

## 2012-11-28 VITALS — BP 104/64

## 2012-11-28 DIAGNOSIS — O09299 Supervision of pregnancy with other poor reproductive or obstetric history, unspecified trimester: Secondary | ICD-10-CM

## 2012-11-28 DIAGNOSIS — O99810 Abnormal glucose complicating pregnancy: Secondary | ICD-10-CM

## 2012-11-28 MED ORDER — FERROUS SULFATE 325 (65 FE) MG PO TBEC: 325.0000 mg | DELAYED_RELEASE_TABLET | Freq: Two times a day (BID) | ORAL | Status: DC

## 2012-11-28 NOTE — Progress Notes (Signed)
NST reviewed and reactive.  

## 2012-11-28 NOTE — Progress Notes (Signed)
P = 100   Pt informed of anemia and need for iron supplement twice daily. Pt also cautioned to guard against constipation by drinking 6-8 glasses of water daily and increase intake of green leafy vegetables. She may also take Colace if needed.  Pt voiced understanding. Interpreter- Dorita.  Baby is [facility] [place] today

## 2012-11-30 ENCOUNTER — Encounter: Payer: Self-pay | Admitting: *Deleted

## 2012-12-01 ENCOUNTER — Ambulatory Visit (INDEPENDENT_AMBULATORY_CARE_PROVIDER_SITE_OTHER): Payer: Self-pay | Admitting: *Deleted

## 2012-12-01 VITALS — BP 106/58

## 2012-12-01 DIAGNOSIS — O99810 Abnormal glucose complicating pregnancy: Secondary | ICD-10-CM

## 2012-12-01 NOTE — Progress Notes (Signed)
P = 84   Transverse lie, back down- head on maternal Lt.  Interpreter present during visit.

## 2012-12-05 ENCOUNTER — Ambulatory Visit (INDEPENDENT_AMBULATORY_CARE_PROVIDER_SITE_OTHER): Payer: Self-pay | Admitting: *Deleted

## 2012-12-05 VITALS — BP 103/58

## 2012-12-05 DIAGNOSIS — O09299 Supervision of pregnancy with other poor reproductive or obstetric history, unspecified trimester: Secondary | ICD-10-CM

## 2012-12-05 NOTE — Progress Notes (Signed)
NST reviewed and reactive.  Andra Matsuo L. Harraway-Smith, M.D., FACOG    

## 2012-12-05 NOTE — Progress Notes (Signed)
P = 94 

## 2012-12-06 NOTE — Progress Notes (Signed)
NST performed today was reviewed and was found to be reactive. AFI 21. 2 cm which is normal.  Continue recommended antenatal testing and prenatal care.

## 2012-12-08 ENCOUNTER — Ambulatory Visit (INDEPENDENT_AMBULATORY_CARE_PROVIDER_SITE_OTHER): Payer: Self-pay | Admitting: Family Medicine

## 2012-12-08 ENCOUNTER — Other Ambulatory Visit: Payer: Self-pay | Admitting: Family Medicine

## 2012-12-08 VITALS — BP 112/61 | Wt 176.1 lb

## 2012-12-08 DIAGNOSIS — O09299 Supervision of pregnancy with other poor reproductive or obstetric history, unspecified trimester: Secondary | ICD-10-CM

## 2012-12-08 DIAGNOSIS — O99810 Abnormal glucose complicating pregnancy: Secondary | ICD-10-CM

## 2012-12-08 LAB — POCT URINALYSIS DIP (DEVICE)
Hgb urine dipstick: NEGATIVE
Protein, ur: NEGATIVE mg/dL
Specific Gravity, Urine: 1.01 (ref 1.005–1.030)
Urobilinogen, UA: 0.2 mg/dL (ref 0.0–1.0)

## 2012-12-08 NOTE — Progress Notes (Signed)
NST reviewed and reactive. FBS 78-93 (2 out of range) 2 hr pp 95-133 (6 of 27 out of range) Last U/S shows baby at 16%, Nml fluid--re-check growth

## 2012-12-08 NOTE — Progress Notes (Signed)
U/S scheduled 12/13/12 at 9 am at [facility].

## 2012-12-08 NOTE — Progress Notes (Signed)
P-65 

## 2012-12-08 NOTE — Patient Instructions (Signed)
Embarazo  Tercer trimestre  (Pregnancy - Third Trimester) El tercer trimestre del embarazo (los ltimos 3 meses) es el perodo en el cual tanto usted como su beb crecen con ms rapidez. El beb alcanza un largo de aproximadamente 50 cm. y pesa entre 2,700 y 4,500 kg. El beb gana ms tejido graso y est listo para la vida fuera del cuerpo de la madre. Mientras estn en el interior, los bebs tienen perodos de sueo y vigilia, succionan el pulgar y tienen hipo. Quizs sienta pequeas contracciones del tero. Este es el falso trabajo de parto. Tambin se las conoce como contracciones de Braxton-Hicks . Es como una prctica del parto. Los problemas ms habituales de esta etapa del embarazo incluyen mayor dificultad para respirar, hinchazn de las manos y los pies por retencin de lquidos y la necesidad de orinar con ms frecuencia debido a que el tero y el beb presionan sobre la vejiga.  EXAMENES PRENATALES   Durante los exmenes prenatales, deber seguir realizndose anlisis de sangre. Estas pruebas se realizan para controlar su salud y la del beb. Los anlisis de sangre se realizan para conocer los niveles de algunos compuestos de la sangre (hemoglobina). La anemia (bajo nivel de hemoglobina) es frecuente durante el embarazo. Para prevenirla, se administran hierro y vitaminas. Tambin le tomarn nuevas anlisis para descartar diabetes. Podrn repetirle algunas de las pruebas que le hicieron previamente.  En cada visita le medirn el tamao del tero. Esto permite asegurar que el beb se desarrolla adecuadamente, segn la fecha del embarazo.  Le controlarn la presin arterial en cada visita prenatal. Esto es para asegurarse de que no sufre toxemia.  Le harn un anlisis de orina en cada visita prenatal, para descartar infecciones, diabetes y la presencia de protenas.  Tambin en cada visita controlarn su peso. Esto se realiza para asegurarse que aumenta de peso al ritmo indicado y que usted y  su beb evolucionan normalmente.  En algunas ocasiones se realiza una prueba de ultrasonido para confirmar el correcto desarrollo y evolucin del beb. Esta prueba se realiza con ondas sonoras inofensivas para el beb, de modo que el profesional pueda calcular ms precisamente la fecha del parto.  Analice con su mdico los analgsicos y la anestesia que recibir durante el trabajo de parto y el parto.  Comente la posibilidad de que necesite una cesrea y qu anestesia se recibir.  Informe a su mdico si sufre violencia familiar mental o fsica. A veces, se indica la prueba especializada sin estrs, la prueba de tolerancia a las contracciones y el perfil biofsico para asegurarse de que el beb no tiene problemas. El estudio del lquido amnitico que rodea al beb se llama amniocentesis. El lquido amnitico se obtiene introduciendo una aguja en el vientre (abdomen ). En ocasiones se lleva a cabo cerca del final del embarazo, si es necesario inducir a un parto. En este caso se realiza para asegurarse que los pulmones del beb estn lo suficientemente maduros como para que pueda vivir fuera del tero. Si los pulmones no han madurado y es peligroso que el beb nazca, se administrar a la madre una inyeccin de cortisona , 1 a 2 das antes del parto. . Esto ayuda a que los pulmones del beb maduren y sea ms seguro su nacimiento.  CAMBIOS QUE OCURREN EN EL TERCER TRIMESTRE DEL EMBARAZO  Su organismo atravesar numerosos cambios durante el embarazo. Estos pueden variar de una persona a otra. Converse con el profesional que la asiste acerca los cambios que   usted note y que la preocupen.   Durante el ltimo trimestre probablemente sienta un aumento del apetito. Es normal tener "antojos" de ciertas comidas. Esto vara de una persona a otra y de un embarazo a otro.  Podrn aparecer las primeras estras en las caderas, abdomen y mamas. Estos son cambios normales del cuerpo durante el embarazo. No existen  medicamentos ni ejercicios que puedan prevenir estos cambios.  La constipacin puede tratarse con un laxante o agregando fibra a su dieta. Beber grandes cantidades de lquidos, tomar fibras en forma de vegetales, frutas y granos integrales es de gran ayuda.  Tambin es beneficioso practicar actividad fsica. Si ha sido una persona activa hasta el embarazo, podr continuar con la mayora de las actividades durante el mismo. Si ha sido menos activa, puede ser beneficioso que comience con un programa de ejercicios, como realizar caminatas. Consulte con el profesional que la asiste antes de comenzar un programa de ejercicios.  Evite el consumo de cigarrillos, el alcohol, los medicamentos no recetados y las "drogas de la calle" durante el embarazo. Estas sustancias qumicas afectan la formacin y el desarrollo del beb. Evite estas sustancias durante todo el embarazo para asegurar el nacimiento de un beb sano.  Podr sentir dolor de espalda, tener vrices en las venas y hemorroides, o si ya los sufra, pueden empeorar.  Durante el tercer trimestre se cansar con ms facilidad, lo cual es normal.  Los movimientos del beb pueden ser ms fuertes y con ms frecuencia.  Puede que note dificultades para respirar normalmente.  El ombligo puede salir hacia afuera.  A veces sale una secrecin amarilla de las mamas, que se llama calostro.  Podr aparecer una secrecin mucosa con sangre. Esto suele ocurrir entre unos pocos das y una semana antes del parto. INSTRUCCIONES PARA EL CUIDADO EN EL HOGAR   Cumpla con las citas de control. Siga las indicaciones del mdico con respecto al uso de medicamentos, los ejercicios y la dieta.  Durante el embarazo debe obtener nutrientes para usted y para su beb. Consuma alimentos balanceados a intervalos regulares. Elija alimentos como carne, pescado, leche y otros productos lcteos descremados, vegetales, frutas, panes integrales y cereales. El mdico le informar  cul es el aumento de peso ideal.  Las relaciones sexuales pueden continuarse hasta casi el final del embarazo, si no se presentan otros problemas como prdida prematura (antes de tiempo) de lquido amnitico, hemorragia vaginal o dolor en el vientre (abdominal).  Realice actividad fsica todos los das, si no tiene restricciones. Consulte con el profesional que la asiste si no sabe con certeza si determinados ejercicios son seguros. El mayor aumento de peso se producir en los ltimos 2 trimestres del embarazo. El ejercicio ayuda a:  Controlar su peso.  Mantenerse en forma para el trabajo de parto y el parto .  Perder peso despus del parto.  Haga reposo con frecuencia, con las piernas elevadas, o segn lo necesite para evitar los calambres y el dolor de cintura.  Use un buen sostn o como los que se usan para hacer deportes para aliviar la sensibilidad de las mamas. Tambin puede serle til si lo usa mientras duerme. Si pierde calostro, podr utilizar apsitos en el sostn.  No utilice la baera con agua caliente, baos turcos y saunas.  Colquese el cinturn de seguridad cuando conduzca. Este la proteger a usted y al beb en caso de accidente.  Evite comer carne cruda y el contacto con los utensilios y desperdicios de los gatos. Estos elementos   contienen grmenes que pueden causar defectos de nacimiento en el beb.  Es fcil perder algo de orina durante el embarazo. Apretar y fortalecer los msculos de la pelvis la ayudar con este problema. Practique detener la miccin cuando est en el bao. Estos son los mismos msculos que necesita fortalecer. Son tambin los mismos msculos que utiliza cuando trata de evitar despedir gases. Puede practicar apretando estos msculos diez veces, y repetir esto tres veces por da aproximadamente. Una vez que conozca qu msculos debe apretar, no realice estos ejercicios durante la miccin. Puede favorecerle una infeccin si la orina vuelve hacia  atrs.  Pida ayuda si tienen necesidades financieras, teraputicas o nutricionales. El profesional podr ayudarla con respecto a estas necesidades, o derivarla a otros especialistas.  Haga una lista de nmeros telefnicos de emergencia y tngalos disponibles.  Planifique como obtener ayuda de familiares o amigos cuando regrese a casa desde el hospital.  Hacer un ensayo sobre la partida al hospital.  Tome clases prenatales con el padre para entender, practicar y hacer preguntas sobre el trabajo de parto y el alumbramiento.  Preparar la habitacin del beb / busque una guardera.  No viaje fuera de la ciudad a menos que sea absolutamente necesario y con el asesoramiento de su mdico.  Use slo zapatos de tacn bajo o sin tacn para tener mejor equilibrio y evitar cadas. USO DE MEDICAMENTOS Y CONSUMO DE DROGAS DURANTE EL EMBARAZO   Tome las vitaminas apropiadas para esta etapa tal como se le indic. Las vitaminas deben contener un miligramo de cido flico. Guarde todas las vitaminas fuera del alcance de los nios. La ingestin de slo un par de vitaminas o tabletas que contengan hierro pueden ocasionar la muerte en un beb o en un nio pequeo.  Evite el uso de todos los medicamentos, incluyendo hierbas, medicamentos de venta libre, sin receta o que no hayan sido sugeridos por su mdico. Slo tome medicamentos de venta libre o medicamentos recetados para el dolor, el malestar o fiebre como lo indique su mdico. No tome aspirina, ibuprofeno (Motrin, Advil, Nuprin) o naproxeno (Aleve) excepto que su mdico se lo indique.  Infrmele al profesional si consume alguna droga.  El alcohol se relaciona con ciertos defectos congnitos. Incluye el sndrome de alcoholismo fetal. Debe evitar absolutamente el consumo de alcohol, en cualquier forma. El fumar produce baja tasa de natalidad y bebs prematuros.  Las drogas ilegales o de la calle son muy perjudiciales para el beb. Estn absolutamente  prohibidas. Un beb que nace de una madre adicta, ser adicto al nacer. Ese beb tendr los mismos sntomas de abstinencia que un adulto. SOLICITE ATENCIN MDICA SI:  Tiene preguntas o preocupaciones relacionadas con el embarazo. Es mejor que llame para formular las preguntas si no puede esperar hasta la prxima visita, que sentirse preocupada por ellas.  DECISIONES ACERCA DE LA CIRCUNCISIN  Usted puede saber o no cul es el sexo de su beb. Si ya sabe que ser un varn, este es el momento de pensar acerca de la circuncisin. La circuncisin es la extirpacin del prepucio. Esta es la piel que cubre el extremo sensible del pene. No hay un motivo mdico que lo justifique. Generalmente la decisin se toma segn lo que sea popular en ese momento, o segn creencias religiosas. Podr conversar estos temas con su mdico o con el pediatra.  SOLICITE ATENCIN MDICA DE INMEDIATO SI:   La temperatura oral le sube a ms de 102 F (38.9 C) o lo que su mdico le   indique.  Tiene una prdida de lquido por la vagina (canal de parto). Si sospecha una ruptura de las membranas, tmese la temperatura y llame al profesional para informarlo sobre esto.  Observa unas pequeas manchas, una hemorragia vaginal o elimina cogulos. Notifique al profesional acerca de la cantidad y de cuntos apsitos est utilizando.  Presenta un olor desagradable en la secrecin vaginal y observa un cambio en el color, de transparente a blanco.  Ha vomitado durante ms de 24 horas.  Siente escalofros o le sube la fiebre.  Le falta el aire.  Siente ardor al orinar.  Baja o sube ms de 2 libras (900 g), o segn lo indicado por el profesional que la asiste.  Observa que sbitamente se le hinchan el rostro, las manos, los pies o las piernas.  Siente dolor en el vientre (abdominal). Las molestias en el ligamento redondo son una causa benigna frecuente de dolor abdominal durante el embarazo. El profesional que la asiste deber  evaluarla.  Presenta dolor de cabeza intenso que no se alivia.  Tiene problemas visuales, visin doble o borrosa.  Si no siente los movimientos del beb durante ms de 1 hora. Si piensa que el beb no se mueve tanto como lo haca habitualmente, coma algo que contenga azcar y recustese sobre el lado izquierdo durante una hora. El beb debe moverse al menos 4  5 veces por hora. Comunquese inmediatamente si el beb se mueve menos que lo indicado.  Se cae, se ve involucrada en un accidente automovilstico o sufre algn tipo de traumatismo.  En su hogar hay violencia mental o fsica. Document Released: 04/22/2005 Document Revised: 01/12/2012 [employer] Patient Information 2013 [employer].  Lactancia materna  (Breastfeeding) Decidir amamantar es una de las mejores elecciones que puede hacer por usted y su beb. La informacin que se brinda a continuacin le dar una breve visin de los beneficios de la lactancia materna as como de las dudas ms frecuentes alrededor de ella.  LOS BENEFICIOS DE AMAMANTAR  Para el beb   La primera leche (calostro ) ayuda al mejor funcionamiento del sistema digestivo del beb.   La leche tiene anticuerpos que provienen de la madre y que ayudan a prevenir las infecciones en el beb.   El beb tiene una menor incidencia de asma, alergias y del sndrome de muerte sbita del lactante (SMSL).   Los nutrientes en la leche materna son mejores para el beb que los preparados para lactantes y la leche materna ayuda a un mejor desarrollo del cerebro del beb.   Los bebs amamantados tienen menos gases, clicos y estreimiento.  Para la mam   La lactancia materna favorece el desarrollo de un vnculo muy especial entre la madre y el beb.   Es ms conveniente, siempre disponible y a la temperatura adecuada y econmico.   Consume caloras en la madre y la ayuda a perder el peso ganado durante el embarazo.   Favorece la contraccin del tero a su  tamao normal, de manera ms rpida y disminuye las hemorragias luego del parto.   Las madres que amamantan tienen menor riesgo de desarrollar cncer de mama.  FRECUENCIA DEL AMAMANTAMIENTO   Un beb sano, nacido a trmino, puede amamantarse con tanta frecuencia como cada hora, o espaciar las comidas cada tres horas.   Observe al beb cuando manifieste signos de hambre. Amamante a su beb si muestra signos de hambre. Esta frecuencia variar de un beb a otro.   Amamntelo tan seguido como el beb lo   solicite, o cuando usted sienta la necesidad de aliviar sus mamas.   Despierte al beb si han pasado 3  4 horas desde la ltima comida.   El amamantamiento frecuente la ayudar a producir ms leche y a prevenir problemas de dolor en los pezones e hinchazn de las mamas.  POSICIN DEL BEBE PARA EL AMAMANTAMIENTO   Ya sea que se encuentre acostada o sentada, asegrese que el abdomen del beb enfrente el suyo.   Sostenga la mama con el pulgar por arriba y los otros 4 dedos por debajo. Asegrese que sus dedos se encuentren lejos del pezn y de la boca del beb.   Empuje suavemente los labios del beb con el pezn o con el dedo.   Cuando la boca del beb se abra lo suficiente, introduzca el pezn y la areola tanto como le sea posible dentro de la boca.   Coloque al beb cerca suyo de modo que su nariz y mejillas toquen las mamas al mamar.  ALIMENTACIN Y SUCCIN   La duracin de cada comida vara de un beb a otro y de una comida a otra.   El beb debe succionar entre 2 y 3 minutos para que le llegue leche. Esto se denomina "bajada". Por este motivo, permita que el nio se alimente en cada mama todo lo que desee. Terminar de mamar cuando haya recibido la cantidad adecuada de nutrientes.   Para detener la succin coloque su dedo en la comisura de la boca del nio y deslcelo entre sus encas antes de quitarle la mama de la boca. Esto la ayudar a evitar el dolor en los pezones.   COMO SABER SI EL BEB OBTIENE LA SUFICIENTE LECHE MATERNA  Preguntarse si el beb obtiene la cantidad suficiente de leche es una preocupacin frecuente entre las madres. Puede asegurarse que el beb tiene la leche suficiente si:   El beb succiona activamente y usted escucha que traga .   El beb parece estar relajado y satisfecho despus de mamar.   El nio se alimenta al menos 8 a 12 veces en 24 horas. Alimntelo hasta que se desprenda por sus propios medios o se quede dormido en la primera mama (al menos durante 10 a 20 minutos), luego ofrzcale el otro lado.   El beb moja 5 a 6 paales desechables (6 a 8 paales de tela) en 24 horas cuando tiene 5  6 das de vida.   Tiene al menos 3 a 4 deposiciones todos los das en los primeros meses. La materia fecal debe ser blanda y amarillenta.   El beb debe aumentar 4 a 6 libras (120 a 170 gr.) por semana despus de los 4 das de vida.   Siente que las mamas se ablandan despus de amamantar  REDUCIR LA CONGESTIN DE LAS MAMAS   Durante la primera semana despus del parto, usted puede experimentar hinchazn en las mamas. Cuando las mamas estn congestionadas, se sienten calientes, llenas y molestas al tacto. Puede reducir la congestin si:   Lo amamanta frecuentemente, cada 2-3 horas. Las mams que amamantan pronto y con frecuencia tienen menos problemas de congestin.   Coloque compresas de hielo en sus mamas durante 10-20 minutos entre cada amamantamiento. Esto ayuda a reducir la hinchazn. Envuelva las bolsas de hielo en una toalla liviana para proteger su piel. Las bolsas de vegetales congelados funcionan bien para este propsito.   Tome una ducha tibia o aplique compresas hmedas calientes en las mamas durante 5 a 10 minutos antes de   cada vez que amamanta. Esto aumenta la circulacin y ayuda a que la leche fluya.   Masajee suavemente la mama antes y durante la alimentacin. Con las puntas de los dedos, masajee desde la pared  torcica hacia abajo hasta llegar al pezn, con movimientos circulares.   Asegrese que el nio vaca al menos una mama antes de cambiar de lado.   Use un sacaleche para vaciar la mama si el beb se duerme o no se alimenta bien. Tambin podr quitarse la leche con esa bomba si tiene que volver al trabajo o siente que las mamas estn congestionadas.   Evite los biberones, chupetes o complementar la alimentacin con agua o jugos en lugar de la leche materna. La leche materna es todo el alimento que el beb necesita. No es necesario que el nio ingiera agua o preparados de bibern. De hecho, es lo mejor para ayudar a que las mamas produzcan ms leche. no darle suplementos al nio durante las primeras semanas.   Verifique que el beb se encuentra en la posicin correcta mientras lo alimenta.   Use un sostn que soporte bien sus mamas y evite los que tienen aro.   Consuma una dieta balanceada y beba lquidos en cantidad.   Descanse con frecuencia, reljese y tome sus vitaminas prenatales para evitar la fatiga, el estrs y la anemia.  Si sigue estas indicaciones, la congestin debe mejorar en 24 a 48 horas. Si an tiene dificultades, consulte a su asesor en lactancia.  CUDESE USTED MISMA  Cuide sus mamas.   Bese o dchese diariamente.   Evite usar jabn en los pezones.   Comience a amamantar del lado izquierdo en una comida y del lado derecho en la siguiente.   Notar que aumenta el flujo de leche a los 2 a 5 das despus del parto. Puede sentir algunas molestias por la congestin, lo que hace que sus mamas estn duras y sensibles. La congestin disminuye en 24 a 48 horas. Mientras tanto, aplique toallas hmedas calientes durante 5 a 10 minutos antes de amamantar. Un masaje suave y la extraccin de un poco de leche antes de amamantar ablandarn las mamas y har ms fcil que el beb se agarre.   Use un buen sostn y seque al aire los pezones durante 3 a 4 minutos luego de cada  alimentacin.   Solo utilice apsitos de algodn.   Utilice lanolina pura sobre los pezones luego de amamantar. No necesita lavarlos luego de alimentar al nio. Otra opcin es exprimir algunas gotas de leche y masajear suavemente los pezones.  Cumpla con estos cuidados   Consuma alimentos bien balanceados y refrigerios nutritivos.   Beba leche, jugos de fruta y agua para satisfacer la sed (alrededor de 8 vasos por da).   Descanse lo suficiente.  Evite los alimentos que usted note que pueden afectar al beb.  SOLICITE ATENCIN MDICA SI:   Tiene dificultad con la lactancia materna y necesita ayuda.   Tiene una zona de color rojo, dura y dolorosa en la mama que se acompaa de fiebre.   El beb est muy somnoliento como para alimentarse bien o tiene problemas para dormir.   Su beb moja menos de 6 paales al da, a los 5 das de vida.   La piel del beb o la parte blanca de sus ojos est ms amarilla de lo que estaba en el hospital.   Se siente deprimida.  Document Released: 07/13/2005 Document Revised: 01/12/2012 [employer] Patient Information 2013 [employer].  

## 2012-12-12 ENCOUNTER — Ambulatory Visit (INDEPENDENT_AMBULATORY_CARE_PROVIDER_SITE_OTHER): Payer: Self-pay | Admitting: *Deleted

## 2012-12-12 DIAGNOSIS — O24419 Gestational diabetes mellitus in pregnancy, unspecified control: Secondary | ICD-10-CM

## 2012-12-12 DIAGNOSIS — O99810 Abnormal glucose complicating pregnancy: Secondary | ICD-10-CM

## 2012-12-12 NOTE — Progress Notes (Signed)
NST reactive on 12/12/12

## 2012-12-13 ENCOUNTER — Ambulatory Visit (HOSPITAL_COMMUNITY)
Admission: RE | Admit: 2012-12-13 | Discharge: 2012-12-13 | Disposition: A | Discharge status: Home / Self Care | Payer: Self-pay | Source: Ambulatory Visit | Attending: Family Medicine | Admitting: Family Medicine

## 2012-12-13 ENCOUNTER — Ambulatory Visit (HOSPITAL_COMMUNITY): Payer: Self-pay

## 2012-12-13 DIAGNOSIS — O09293 Supervision of pregnancy with other poor reproductive or obstetric history, third trimester: Secondary | ICD-10-CM

## 2012-12-13 DIAGNOSIS — O09299 Supervision of pregnancy with other poor reproductive or obstetric history, unspecified trimester: Secondary | ICD-10-CM | POA: Insufficient documentation

## 2012-12-13 DIAGNOSIS — O34219 Maternal care for unspecified type scar from previous cesarean delivery: Secondary | ICD-10-CM | POA: Insufficient documentation

## 2012-12-13 DIAGNOSIS — O0993 Supervision of high risk pregnancy, unspecified, third trimester: Secondary | ICD-10-CM

## 2012-12-13 DIAGNOSIS — IMO0002 Reserved for concepts with insufficient information to code with codable children: Secondary | ICD-10-CM

## 2012-12-13 DIAGNOSIS — O99810 Abnormal glucose complicating pregnancy: Secondary | ICD-10-CM | POA: Insufficient documentation

## 2012-12-13 DIAGNOSIS — O24419 Gestational diabetes mellitus in pregnancy, unspecified control: Secondary | ICD-10-CM

## 2012-12-13 DIAGNOSIS — R7309 Other abnormal glucose: Secondary | ICD-10-CM

## 2012-12-13 NOTE — Progress Notes (Signed)
[facility] ultrasound  Indication: 22 yr old [id number] at [redacted]w[redacted]d by anatomy survey ultrasound dating with gestational diabetes A1 for follow up fetal growth. Previous pregnancy with 28 week fetal demise, history of preeclampsia.  Findings:  1. Single intrauterine pregnancy. 2. Estimated fetal weight is in the 57th%. The long bones lag by 2-3 weeks. The long bones are normal in shape and echogenicity. 3. Posterior placenta without evidence of previa. 4. Normal amniotic fluid index. 5. The limited anatomy survey is normal.  6. Fetus is in breech presentation.  Recommendations: 1. Based on review of the chart I recommend using estimated due date of 01/23/13 based on anatomy survey done in Dr. Elsie Stain office. Clinical correlation recommended. 2. Normal limited anatomy survey. 3. History of fetal demise: - no consult requested - recommend fetal growth ultrasounds every 4 weeks - recommend antenatal testing - recommend delivery by estimated due date but not prior to 39 weeks in the absence of other complications or without amniocentesis for fetal lung maturity- also see below regarding delivery timing 4. History of preeclampsia: - no consult requested - recommend close surveillance for the development of signs/symptoms of preeclampsia 5. Lagging long bones: - likely constitutional - also difficult to determine degree given uncertainty in dating - continue to follow growth - inform Pediatrics at delivery - bones appear normal in shape and echogenicity 6. Gestational diabetes: - no consult requested - diet controlled 7. Previous C section: - patient is unsure if was classical C section - if strong suspicion for classical C section recommend delivery via repeat C section at 37 weeks otherwise may have trial of labor with continuous monitoring 8. Fetus currently in breech presentation: - recommend continue to follow and may offer an external cephalic version if still  breech  Eulis Foster, MD

## 2012-12-15 ENCOUNTER — Encounter: Payer: Self-pay | Admitting: Obstetrics & Gynecology

## 2012-12-15 ENCOUNTER — Ambulatory Visit (INDEPENDENT_AMBULATORY_CARE_PROVIDER_SITE_OTHER): Payer: Self-pay | Admitting: Obstetrics & Gynecology

## 2012-12-15 VITALS — BP 100/64 | Wt 175.7 lb

## 2012-12-15 DIAGNOSIS — O99810 Abnormal glucose complicating pregnancy: Secondary | ICD-10-CM

## 2012-12-15 DIAGNOSIS — O09299 Supervision of pregnancy with other poor reproductive or obstetric history, unspecified trimester: Secondary | ICD-10-CM

## 2012-12-15 DIAGNOSIS — O24419 Gestational diabetes mellitus in pregnancy, unspecified control: Secondary | ICD-10-CM

## 2012-12-15 LAB — POCT URINALYSIS DIP (DEVICE)
Hgb urine dipstick: NEGATIVE
Nitrite: NEGATIVE
Protein, ur: NEGATIVE mg/dL
Urobilinogen, UA: 0.2 mg/dL (ref 0.0–1.0)
pH: 7 (ref 5.0–8.0)

## 2012-12-15 NOTE — Progress Notes (Signed)
NST reactive today. Doesn't have her book but states BG values are stable. [place] yesterday showed breech position, 57 %ile

## 2012-12-15 NOTE — Progress Notes (Signed)
P = 89    US done @ [facility] on 5/20- MD to review notes today.

## 2012-12-15 NOTE — Patient Instructions (Signed)
Diabetes mellitus gestacional   (Gestational Diabetes Mellitus)  La diabetes mellitus gestacional, más comúnmente conocida como diabetes gestacional es un tipo de diabetes que desarrollan algunas mujeres durante el embarazo. En la diabetes gestacional, el páncreas no produce suficiente insulina (una hormona), las células son menos sensibles a la insulina que se produce (resistencia a la insulina), o ambos. Normalmente, la insulina mueve los azúcares de los alimentos a las células de los tejidos. Las células de los tejidos utilizan los azúcares para obtener energía. La falta de insulina o la falta de una respuesta normal a la insulina hace que el exceso de azúcar se acumule en la sangre en lugar de penetrar en las células de los tejidos. Como resultado, se desarrollan los niveles altos de azúcar en la sangre (hiperglucemia). El efecto de los niveles altos de azúcar (glucosa) puede causar muchas complicaciones.   FACTORES DE RIESGO  Usted tiene mayor probabilidad de desarrollar diabetes gestacional si tiene antecedentes familiares de diabetes y también si tiene uno o más de los siguientes factores de riesgo:   · Índice de masa corporal superior a 30 (obesidad).  · Embarazo previo con diabetes gestacional.  · Mayor edad en el momento del embarazo.  Si se mantienen los niveles de glucosa en sangre en un rango normal durante el embarazo, las mujeres pueden tener un embarazo saludable. Si no controla bien sus niveles de glucosa en sangre, pueden tener tener riesgos usted, su bebé antes de nacer (feto), el trabajo de parto y el parto, o el bebé recién nacido.   SÍNTOMAS   Si se presentan síntomas, éstos son similares a los síntomas que normalmente experimentará durante el embarazo. Los síntomas de la diabetes gestacional son:   · Aumento de la sed (polidipsia).  · Aumento de ganas de orinar (poliuria).  · Aumento de ganas de orinar durante la noche (nicturia).  · Pérdida de peso. Pérdida de peso que puede ser muy  rápida.  · Infecciones frecuentes y recurrentes.  · Cansancio (fatiga).  · Debilidad.  · Cambios en la visión, como visión borrosa.  · Olor a fruta en el aliento.  · Dolor abdominal.  DIAGNÓSTICO  La diabetes se diagnostica cuando hay aumento de los niveles de glucosa en la sangre. El nivel de glucosa en la sangre puede controlarse en uno o más de los siguientes análisis de sangre:   · Medición de glucosa en sangre en ayunas. No deberá comer durante al menos 8 horas antes de que se tome la muestra de sangre.  · Análisis al azar de glucosa en sangre. El nivel de glucosa en sangre se controla en cualquier momento del día sin importar el momento en que haya comido.  · Pruebas de glucosa de sangre de hemoglobina A1c. Un análisis de la hemoglobina A1c proporciona información sobre el control de la glucosa en la sangre durante los últimos 3 meses.  · Prueba oral de tolerancia a la glucosa (SOG). La glucosa en la sangre se mide después de no haber comido (ayuno) durante 1-3 horas y después de beber una bebida que contiene glucosa. Dado que las hormonas que causan la resistencia a la insulina son más altas alrededor de las semanas 24-28 de embarazo, generalmente se realiza un SOG durante ese tiempo. Si tiene factores de riesgo para la diabetes gestacional, su médico puede detectarla antes de las 24 semanas de embarazo.  TRATAMIENTO   · Usted tendrá que tomar medicamentos para la diabetes o insulina diariamente para mantener los niveles de glucosa en sangre   en el rango deseado.  · Usted tendrá que hacer coincidir la dosis de insulina con el ejercicio y la elección de alimentos saludables.  El objetivo del tratamiento es mantener el nivel de glucosa en sangre en 60-99 mg / dl antes de la comida (preprandial), a la hora de acostarse y durante la noche mientras dure su embarazo. El objetivo del tratamiento es mantener el mayor pico de azúcar en sangre después de la comida (glucosa postprandial) en 100-140 mg/dl.   INSTRUCCIONES  PARA EL CUIDADO EN EL HOGAR   · Haga que su nivel de hemoglobina A1c sea verificado dos veces al año.  · Realice un control diario de glucosa en sangre según las indicaciones de su médico. Es común realizar controles con frecuencia de la glucosa en sangre.  · Supervise las cetonas en la orina cuando esté enferma y según las indicaciones de su médico.  · Tome su medicamento para la diabetes y la insulina según las indicaciones de su médico para mantener el nivel de glucosa en sangre en su rango deseado.  · Nunca se quede sin medicamentos para la diabetes o sin insulina. Es necesario recibirla todos los días.  · Ajuste la insulina sobre la base de la ingesta de hidratos de carbono. Los hidratos de carbono pueden aumentar los niveles de glucosa en la sangre, pero deben incluirse en su dieta. Los hidratos de carbono aportan vitaminas, minerales y fibra que son una parte esencial de una dieta saludable. Los hidratos de carbono se encuentran en frutas, verduras, granos enteros, productos lácteos, legumbres y alimentos que contienen azúcares añadidos.  ·   · Consuma alimentos saludables. Alterne 3 comidas con 3 colaciones.  · Mantenga un aumento de peso saludable. El aumento del peso total varía de acuerdo con el índice de masa corporal antes del embarazo ([facility]).  · Lleve una tarjeta de alerta médica o lleve una pulsera de alerta médica.  · Lleve consigo un refrigerio de 15 gramos de hidrato de carbonos en todo momento para controlar los niveles bajos de glucosa en sangre (hipoglucemia). Algunos ejemplos de aperitivos de 15 gramos de hidratos de carbono son:  · Tabletas de glucosa, 3 o 4  ·   Gel de glucosa, tubo de 15 gramos  · Pasas de uva, 2 cucharadas (24 g)  · Caramelos de goma, 6  · Galletas de animales, 8  · Jugo de fruta, soda regular, o leche baja en grasa, 4 onzas (120 ml)  · Pastillas gomitas, 9  ·   · Reconocer la hipoglucemia. Durante el embarazo la hipoglucemia se produce cuando hay niveles de glucosa en  sangre de 60 mg/dl o menos. El riesgo de hipoglucemia aumenta durante el ayuno o saltarse las comidas, durante o después del ejercicio intenso, y durante el sueño. Los síntomas de hipoglucemia son:  · Temblores o sacudidas.  · Disminución de capacidad de concentración.  · Sudoración.  · Aumento en el ritmo cardíaco  · Dolor de cabeza.  · Boca seca.  · Hambre.  · Irritabilidad.  · Ansiedad.  · Sueño agitado.  · Alteración del habla o de la coordinación.  · Confusión.  · Tratar la hipoglucemia rápidamente. Si usted está alerta y puede tragar con seguridad, siga la regla de 15:15:  · Tome de 15 a 20 gramos de glucosa de acción rápida o hidratos de carbono . Las opciones de acción rápida son un gel de glucosa, unas tabletas de glucosa, o 4 onzas (120 ml) de jugo de frutas, soda   regular, o leche baja en grasa.  · Compruebe su nivel de glucosa en sangre 15 minutos después de tomar la glucosa.  ·   Tome de 15 a 20 gramos más de glucosa si al repetir el nivel de glucosa en la sangre todavía es de 70 mg / dl o inferior.  · Coma una comida o una colación dentro de 1 hora una vez que los niveles de glucosa en la sangre vuelven a la normalidad.  · Esté alerta a la poliuria y polidipsia, que son los primeros signos de hiperglucemia. El conocimiento temprano de la hiperglucemia permite un tratamiento oportuno. Controle la hiperglucemia según le indicó su médico.  · Haga actividad física por lo menos 30 minutos al día o como lo indique su médico. Se recomienda diez minutos de actividad física cronometrados 30 minutos después de cada comida para controlar los niveles de glucosa en sangre postprandial.  · Ajuste su dosis de insulina y la ingesta de alimentos, según sea necesario, si se inicia un nuevo ejercicio o deporte.  · Siga su plan diario de enfermo en algún momento que no puede comer o beber como de costumbre.  · Evite el tabaco y el alcohol.  · Concurra regularmente a las visitas de control con el médico.  · Siga el consejo  de su médico respecto a los controles prenatales y posteriores al parto (post-parto), a la planificación de las comidas, al ejercicio, a los medicamentos, a las vitaminas, a los análisis de sangre, a otras pruebas médicas y físicas.  · Cuide diariamente la piel y los pies. Examine su piel y los pies diariamente para detectar cortes, moretones, enrojecimiento, problemas en las uñas, sangrado, ampollas o llagas.  · Cepíllese los dientes y encías por lo menos dos veces al día y use hilo dental al menos una vez por día. Concurra regularmente a las visitas de control con el dentista.  · Programe un examen de vista durante el primer trimestre de su embarazo o como lo indique su médico.  · Comparta su plan de control de diabetes en su trabajo o en la escuela.  · Manténgase al día con las vacunas.  · Aprenda a manejar el estrés.  · Obtenga educación continuada y ayuda para la diabetes cuando sea necesario.  SOLICITE ATENCIÓN MÉDICA SI:   · No puede comer alimentos o beber por más de 6 horas.  · Tiene náuseas o ha vomitado durante más de 6 horas.  · Tiene un nivel de glucosa en sangre de 200 mg/dl y cetonas en la orina.  · Presenta algún cambio en el estado mental.  · Tiene problemas de visión.  · Sufre un dolor persistente de cabeza.  · Tiene dolor o molestias en el abdomen.  · Desarrolla una enfermedad grave adicional.  · Tiene diarrea durante más de 6 horas.  · Ha estado enferma o ha tenido fiebre durante un par de días y no mejora.  SOLICITE ATENCIÓN MÉDICA DE INMEDIATO SI:   · Tiene dificultad para respirar.  · Ya no siente los movimientos del bebé.  · Está sangrando o tiene flujo vaginal.  · Comienza a tener contracciones o trabajo de parto prematuro.  ASEGÚRESE DE QUE:  · Comprende estas instrucciones.  · Controlará su enfermedad.  · Solicitará ayuda de inmediato si no mejora o si empeora.  Document Released: 04/22/2005 Document Revised: 04/06/2012  [employer]® Patient Information ©2014 [employer].

## 2012-12-20 ENCOUNTER — Ambulatory Visit (INDEPENDENT_AMBULATORY_CARE_PROVIDER_SITE_OTHER): Payer: Self-pay | Admitting: *Deleted

## 2012-12-20 VITALS — BP 109/62

## 2012-12-20 DIAGNOSIS — O99810 Abnormal glucose complicating pregnancy: Secondary | ICD-10-CM

## 2012-12-20 NOTE — Progress Notes (Signed)
P = 100   CBG log book reviewed - all values wnl. Pt advised to bring log book to all appts and to bring her meter to appt on 6/2.

## 2012-12-22 ENCOUNTER — Encounter: Payer: Self-pay | Admitting: Obstetrics and Gynecology

## 2012-12-22 ENCOUNTER — Ambulatory Visit (INDEPENDENT_AMBULATORY_CARE_PROVIDER_SITE_OTHER): Payer: Self-pay | Admitting: Obstetrics and Gynecology

## 2012-12-22 VITALS — BP 114/65 | Wt 177.9 lb

## 2012-12-22 DIAGNOSIS — O99810 Abnormal glucose complicating pregnancy: Secondary | ICD-10-CM

## 2012-12-22 DIAGNOSIS — O0993 Supervision of high risk pregnancy, unspecified, third trimester: Secondary | ICD-10-CM

## 2012-12-22 DIAGNOSIS — O34219 Maternal care for unspecified type scar from previous cesarean delivery: Secondary | ICD-10-CM

## 2012-12-22 DIAGNOSIS — Z3490 Encounter for supervision of normal pregnancy, unspecified, unspecified trimester: Secondary | ICD-10-CM

## 2012-12-22 DIAGNOSIS — R6889 Other general symptoms and signs: Secondary | ICD-10-CM

## 2012-12-22 DIAGNOSIS — O09299 Supervision of pregnancy with other poor reproductive or obstetric history, unspecified trimester: Secondary | ICD-10-CM

## 2012-12-22 DIAGNOSIS — IMO0002 Reserved for concepts with insufficient information to code with codable children: Secondary | ICD-10-CM

## 2012-12-22 DIAGNOSIS — O24419 Gestational diabetes mellitus in pregnancy, unspecified control: Secondary | ICD-10-CM

## 2012-12-22 DIAGNOSIS — O09293 Supervision of pregnancy with other poor reproductive or obstetric history, third trimester: Secondary | ICD-10-CM

## 2012-12-22 LAB — POCT URINALYSIS DIP (DEVICE)
Leukocytes, UA: NEGATIVE
Nitrite: NEGATIVE
Protein, ur: NEGATIVE mg/dL
pH: 7 (ref 5.0–8.0)

## 2012-12-22 NOTE — Progress Notes (Signed)
Patient doing well without complaints. Per MFM recommendation, EDC changed to 6/30. Discussed cesarean section and risk of rupture with classical incision with TOLAC. Patient is 100% sure that she was told she can have a trial of labor which she desires. CBGs all within range. Cultures next visit. FM/PTL precautions reviewed. NST reviewed and reactive

## 2012-12-22 NOTE — Progress Notes (Signed)
P = 100    Pt reports pelvic pressure

## 2012-12-26 ENCOUNTER — Ambulatory Visit (INDEPENDENT_AMBULATORY_CARE_PROVIDER_SITE_OTHER): Payer: Self-pay | Admitting: Family Medicine

## 2012-12-26 ENCOUNTER — Encounter: Payer: Self-pay | Admitting: Family Medicine

## 2012-12-26 VITALS — BP 106/59 | Temp 98.0°F | Wt 178.9 lb

## 2012-12-26 DIAGNOSIS — O34219 Maternal care for unspecified type scar from previous cesarean delivery: Secondary | ICD-10-CM

## 2012-12-26 DIAGNOSIS — O09299 Supervision of pregnancy with other poor reproductive or obstetric history, unspecified trimester: Secondary | ICD-10-CM

## 2012-12-26 DIAGNOSIS — O99810 Abnormal glucose complicating pregnancy: Secondary | ICD-10-CM

## 2012-12-26 DIAGNOSIS — O24419 Gestational diabetes mellitus in pregnancy, unspecified control: Secondary | ICD-10-CM

## 2012-12-26 LAB — POCT URINALYSIS DIP (DEVICE)
Leukocytes, UA: NEGATIVE
Protein, ur: NEGATIVE mg/dL
Specific Gravity, Urine: 1.02 (ref 1.005–1.030)
Urobilinogen, UA: 0.2 mg/dL (ref 0.0–1.0)

## 2012-12-26 LAB — OB RESULTS CONSOLE GC/CHLAMYDIA
Chlamydia: NEGATIVE
Gonorrhea: NEGATIVE

## 2012-12-26 MED ORDER — GLYBURIDE 2.5 MG PO TABS: 2.5000 mg | ORAL_TABLET | Freq: Every day | ORAL | Status: DC

## 2012-12-26 NOTE — Patient Instructions (Signed)
Diabetes mellitus gestacional  (Gestational Diabetes Mellitus) La diabetes mellitus gestacional, ms comnmente conocida como diabetes gestacional es un tipo de diabetes que desarrollan algunas mujeres durante el [place]. En la diabetes gestacional, el pncreas no produce suficiente insulina (una hormona), las clulas son menos sensibles a la insulina que se produce (resistencia a la insulina), o ambos.Normalmente, la [employer] azcares de los alimentos a las clulas de los tejidos. Las clulas de los tejidos [employer] azcares para Dealer. La falta de insulina o la falta de una respuesta normal a la insulina hace que el exceso de azcar se acumule en la sangre en lugar de Location manager en las clulas de los tejidos. Como resultado, se desarrollan los niveles altos de Dispensing optician (hiperglucemia). El efecto de los niveles altos de azcar (glucosa) puede causar muchas complicaciones.  FACTORES DE RIESGO Usted tiene mayor probabilidad de desarrollar diabetes gestacional si tiene antecedentes familiares de diabetes y tambin si tiene uno o ms de los siguientes factores de riesgo:   ndice de masa corporal superior a 30 (obesidad).  Embarazo previo con diabetes gestacional.  Mayor edad en el momento del [place]. Si se mantienen los niveles de Multimedia programmer en un rango normal durante el [place], las mujeres pueden tener un embarazo saludable. Si no controla bien sus niveles de glucosa en sangre, pueden tener tener riesgos usted, su beb antes de nacer (feto), el [place] de parto y [place], o el beb recin nacido.  SNTOMAS  Si se presentan sntomas, stos son similares a los sntomas que normalmente experimentar durante el embarazo. Los sntomas de la diabetes gestacional son:   Lovey Newcomer de la sed (polidipsia).  Aumento de ganas de Garment/textile technologist (poliuria).  Aumento de ganas de orinar durante la noche (nicturia).  Prdida de peso. Prdida de [employer] puede ser muy  rpida.  Infecciones frecuentes y recurrentes.  Cansancio (fatiga).  Debilidad.  Cambios en la visin, como visin borrosa.  Olor a Medical illustrator.  Dolor abdominal. DIAGNSTICO La diabetes se diagnostica cuando hay aumento de los niveles de glucosa en la [place]. El nivel de glucosa en la sangre puede controlarse en uno o ms de los siguientes anlisis de sangre:   Medicin de glucosa en sangre en ayunas. No deber comer durante al menos 8 horas antes de que se tome la [place] de [place].  Anlisis al azar de glucosa en sangre. El nivel de glucosa en sangre se controla en cualquier momento del da sin importar el momento en que haya comido.  Pruebas de glucosa de sangre de hemoglobina A1c. Un anlisis de la hemoglobina A1c proporciona informacin sobre el control de la glucosa en la sangre durante los ltimos 3 meses.  Prueba oral de tolerancia a la glucosa (SOG). La glucosa en la sangre se mide despus de no haber comido (ayuno) durante 1-3 horas y despus de beber una bebida que contiene glucosa. Dado que las hormonas que causan la resistencia a la insulina son ms altas alrededor de las semanas 24-28 de [place], generalmente se realiza un SOG durante ese [place]. Si tiene factores de riesgo para la diabetes gestacional, su mdico puede detectarla antes de las 24 semanas de [place]. TRATAMIENTO   Usted tendr que tomar medicamentos para la diabetes o insulina diariamente para Theatre manager los niveles de glucosa en sangre en el rango deseado.  Usted tendr [employer] coincidir la dosis de insulina con el ejercicio y Marine scientist de alimentos saludables. El [place] del tratamiento es  en el rango deseado.  · Usted tendrá que hacer coincidir la dosis de insulina con el ejercicio y la elección de alimentos saludables.  El objetivo del tratamiento es mantener el nivel de glucosa en sangre en 60-99 mg / dl antes de la comida (preprandial), a la hora de acostarse y durante la noche mientras dure su embarazo. El objetivo del tratamiento es mantener el mayor pico de azúcar en sangre después de la comida (glucosa postprandial) en 100-140 mg/dl.   INSTRUCCIONES  PARA EL CUIDADO EN EL HOGAR   · Haga que su nivel de hemoglobina A1c sea verificado dos veces al año.  · Realice un control diario de glucosa en sangre según las indicaciones de su médico. Es común realizar controles con frecuencia de la glucosa en sangre.  · Supervise las cetonas en la orina cuando esté enferma y según las indicaciones de su médico.  · Tome su medicamento para la diabetes y la insulina según las indicaciones de su médico para mantener el nivel de glucosa en sangre en su rango deseado.  · Nunca se quede sin medicamentos para la diabetes o sin insulina. Es necesario recibirla todos los días.  · Ajuste la insulina sobre la base de la ingesta de hidratos de carbono. Los hidratos de carbono pueden aumentar los niveles de glucosa en la sangre, pero deben incluirse en su dieta. Los hidratos de carbono aportan vitaminas, minerales y fibra que son una parte esencial de una dieta saludable. Los hidratos de carbono se encuentran en frutas, verduras, granos enteros, productos lácteos, legumbres y alimentos que contienen azúcares añadidos.  ·   · Consuma alimentos saludables. Alterne 3 comidas con 3 colaciones.  · Mantenga un aumento de peso saludable. El aumento del peso total varía de acuerdo con el índice de masa corporal antes del embarazo (IMC).  · Lleve una tarjeta de alerta médica o lleve una pulsera de alerta médica.  · Lleve consigo un refrigerio de 15 gramos de hidrato de carbonos en todo momento para controlar los niveles bajos de glucosa en sangre (hipoglucemia). Algunos ejemplos de aperitivos de 15 gramos de hidratos de carbono son:  · Tabletas de glucosa, 3 o 4  ·   Gel de glucosa, tubo de 15 gramos  · Pasas de uva, 2 cucharadas (24 g)  · Caramelos de goma, 6  · Galletas de animales, 8  · Jugo de fruta, soda regular, o leche baja en grasa, 4 onzas (120 ml)  · Pastillas gomitas, 9  ·   · Reconocer la hipoglucemia. Durante el embarazo la hipoglucemia se produce cuando hay niveles de glucosa en  sangre de 60 mg/dl o menos. El riesgo de hipoglucemia aumenta durante el ayuno o saltarse las comidas, durante o después del ejercicio intenso, y durante el sueño. Los síntomas de hipoglucemia son:  · Temblores o sacudidas.  · Disminución de capacidad de concentración.  · Sudoración.  · Aumento en el ritmo cardíaco  · Dolor de cabeza.  · Boca seca.  · Hambre.  · Irritabilidad.  · Ansiedad.  · Sueño agitado.  · Alteración del habla o de la coordinación.  · Confusión.  · Tratar la hipoglucemia rápidamente. Si usted está alerta y puede tragar con seguridad, siga la regla de 15:15:  · Tome de 15 a 20 gramos de glucosa de acción rápida o hidratos de carbono . Las opciones de acción rápida son un gel de glucosa, unas tabletas de glucosa, o 4 onzas (120 ml) de jugo de frutas, soda   niveles de glucosa en la sangre vuelven a la normalidad.  Est alerta a la poliuria y polidipsia, que son los primeros signos de hiperglucemia. El conocimiento temprano de la hiperglucemia permite un tratamiento oportuno. Controle la hiperglucemia segn le indic su mdico.  Haga actividad fsica por lo menos 30 minutos al da o como lo indique su mdico. Se recomienda diez minutos de actividad fsica cronometrados 30 minutos despus de cada comida para Chief Operating Officer los niveles de glucosa en sangre postprandial.  Ajuste su dosis de insulina y la ingesta de alimentos, segn sea necesario, si se inicia un nuevo ejercicio o deporte.  Siga su plan diario de enfermo en algn momento que no puede comer o beber como de costumbre.  Evite el tabaco y el alcohol.  Concurra regularmente a las visitas de control con el mdico.  Siga el consejo  de su mdico respecto a los controles prenatales y posteriores al parto (post-parto), a la planificacin de las comidas, al ejercicio, a los [address], a las vitaminas, a los anlisis de [place], a otras pruebas mdicas y fsicas.  Cuide diariamente la piel y los pies. Examine su piel y los pies diariamente para detectar cortes, moretones, enrojecimiento, problemas en las uas, sangrado, ampollas o llagas.  Cepllese los dientes y encas por lo menos dos veces al da y use hilo dental al menos una vez por da. Concurra regularmente a las visitas de control con el dentista.  Programe un examen de vista durante el primer trimestre de su embarazo o como lo indique su mdico.  Comparta su plan de control de diabetes en su trabajo o en la escuela.  Mantngase al da con las vacunas.  Aprenda a Dealer.  Obtenga educacin continuada y ayuda para la diabetes cuando sea necesario. SOLICITE ATENCIN MDICA SI:   No puede comer alimentos o beber por ms de 6 horas.  Tiene nuseas o ha vomitado durante ms de 6 horas.  Tiene un nivel de glucosa en sangre de 200 mg/dl y [employer].  Presenta algn cambio en el estado mental.  Tiene problemas de visin.  Sufre un dolor persistente de Training and development officer.  Tiene dolor o molestias en el abdomen.  Desarrolla una enfermedad grave adicional.  Tiene diarrea durante ms de 6 horas.  Ha estado enferma o ha tenido fiebre durante un par [address] y no mejora. SOLICITE ATENCIN MDICA DE INMEDIATO SI:   Tiene dificultad para respirar.  Ya no siente los movimientos del beb.  Est sangrando o tiene flujo vaginal.  Comienza a tener contracciones o trabajo de [place] prematuro. ASEGRESE DE QUE:  Comprende estas instrucciones.  Controlar su enfermedad.  Solicitar ayuda de inmediato si no mejora o si empeora. Document Released: 04/22/2005 Document Revised: 04/06/2012 [facility] Patient Information 2014 [place], [place].  Lactancia  materna  (Breastfeeding)  El cambio hormonal durante el Psychiatrist produce el desarrollo del tejido [place] y un aumento en el nmero y tamao de los conductos galactforos. La hormona prolactina permite que las protenas, los azcares y las grasas de la sangre produzcan la [employer] materna en las glndulas productoras de [place]. La hormona progesterona impide que la leche materna sea liberada antes del nacimiento del beb. Despus del nacimiento del beb, su nivel de progesterona disminuye permitiendo que la leche materna sea [place]. Pensar en el beb, as como la succin o Theatre manager, pueden estimular la liberacin de [place] de las glndulas productoras de [place].  La decisin de Company secretary) es una de las mejores  opciones que usted puede hacer para usted y su beb. La informacin que sigue da una breve resea de los beneficios, as Lexicographer que debe saber sobre la [place].  LOS BENEFICIOS DE AMAMANTAR  Para el beb   La primera leche (calostro) ayuda al mejor funcionamiento del sistema digestivo del beb.   La leche tiene anticuerpos que provienen de la madre y que ayudan a prevenir las infecciones en el beb.   El beb tiene una menor incidencia de asma, alergias y del sndrome de muerte sbita del lactante (SMSL).   Los nutrientes de la [place] materna son mejores para el beb que la [place].  La leche materna mejora el desarrollo cerebral del beb.   Su beb tendr menos gases, clicos y estreimiento.  Es menos probable que el beb desarrolle otras enfermedades, como obesidad infantil, asma o diabetes mellitus. Para usted   La lactancia materna favorece el desarrollo de un vnculo muy especial entre la madre y el beb.   Es ms conveniente, siempre disponible, a la [place] y [place].   La lactancia materna ayuda a quemar caloras y a perder el peso ganado durante el [place].   Hace que el tero se contraiga ms  rpidamente a su tamao normal y Cons[employer] Edison sangrado despus del [place].   Las [employer] que amamantan tienen menos riesgo de Environmental education officer osteoporosis o cncer de mama o de ovario en el futuro.  FRECUENCIA DEL AMAMANTAMIENTO   Un beb sano, nacido a trmino, puede amamantarse con tanta frecuencia como cada hora, o espaciar las comidas cada tres horas. La frecuencia en la lactancia varan de un beb a otro.   Los recin nacidos deben ser alimentados por lo menos cada 2-3 horas Administrator y cada 4-5 horas durante la noche. Usted debe amamantarlo un mnimo de 8 tomas en un perodo de 24 horas.  Despierte al beb para amamantarlo si han pasado 3-4 horas desde la ltima comida.  Amamante cuando sienta la necesidad de reducir la plenitud de sus senos o cuando el beb muestre signos de [place]. Las seales de que el beb puede Gentry Fitz son:  Lenora Boys su estado de alerta o vigilancia.  Se estira.  Mueve la cabeza de un lado a otro.  Mueve la cabeza y abre la boca cuando se le toca la mejilla o la boca (reflejo de succin).  Aumenta las vocalizaciones, tales como sonidos de succin, relamerse los labios, arrullos, suspiros, o chirridos.  Mueve la [employer] boca.  Se chupa con ganas los dedos o las manos.  Agitacin.  Llanto intermitente.  Los signos de hambre extrema requerirn que lo calme y lo consuele antes de tratar de alimentarlo. Los signos de hambre extrema son:  Agitacin.  Llanto fuerte e intenso.  Gritos.  El amamantamiento frecuente la ayudar a producir ms [place] y a Education officer, community de Engineer, mining en los pezones e hinchazn de las [place].  LACTANCIA MATERNA   Ya sea que se encuentre acostada o sentada, asegrese que el abdomen del beb est enfrente el suyo.   Sostenga la mama con el pulgar por arriba y los otros 4 dedos por debajo del pezn. Asegrese que sus dedos se encuentren lejos del pezn y de la boca del beb.   Empuje suavemente los labios del beb  con el pezn o con el dedo.   Cuando la boca del beb se abra lo suficiente, introduzca el pezn y la zona oscura que lo rodea (areola)  tanto como le sea posible dentro de la boca.  Debe haber ms areola visible por arriba del labio superior que por debajo del labio inferior.  La lengua del beb debe estar entre la enca inferior y el seno.  Asegrese de que la boca del beb est en la posicin correcta alrededor del pezn (prendida). Los labios del beb deben crear un sello sobre su pecho.  Las seales de que el beb se ha prendido eficazmente al pezn son:  Payton Doughty o succiona sin dolor.  Se escucha que traga [employer].  No hace ruidos ni chasquidos.  Hay movimientos musculares por arriba y por delante de sus odos al Printmaker.  El beb debe succionar unos 2-3 minutos para que salga la [place]. Permita que el nio se alimente en cada mama todo lo que desee. Alimente al beb hasta que se desprenda o se quede dormido en Freight forwarder y luego ofrzcale el segundo pecho.  Las seales de que el beb est lleno y satisfecho son:  Disminuye gradualmente el nmero de succiones o no succiona.  Se queda dormido.  Extiende o relaja su cuerpo.  Retiene una pequea cantidad de [facility] [facility] boca.  Se desprende del pecho por s mismo.  Los signos de una lactancia materna eficaz son:  Los senos han aumentado la firmeza, el peso y el tamao antes de la alimentacin.  Son ms blandos despus de amamantar.  Un aumento del volumen de [place], y tambin el cambio de su consistencia y color se producen hacia el quinto da de Tour manager.  La congestin mamaria se [place] al dar de [place].  Los pezones no duelen, ni estn agrietados ni sangran.  De ser necesario, interrumpa la succin poniendo su dedo en la esquina de la boca del beb y deslizando el dedo entre sus encas. A continuacin, retire la mama de su boca.  Es comn que los bebs regurgiten un poco despus de  comer.  A menudo los bebs tragan aire al alimentarse. Esto puede hacer que se sienta molesto. Hacer eructar al beb al [place] de pecho puede ser de [place].  Se recomiendan suplementos de vitamina D para los bebs que reciben slo [address].  Evite el uso del chupete durante las primeras 4 a 6 semanas de vida.  Evite la alimentacin suplementaria con agua, frmula o jugo en lugar de la [employer]. La leche materna es todo el alimento que el beb necesita. No es necesario que el nio ingiera agua o preparados de bibern. Sus pechos producirn ms leche si se evita la alimentacin suplementaria durante las primeras semanas. COMO SABER SI EL BEB OBTIENE LA SUFICIENTE LECHE MATERNA  Preguntarse si el beb obtiene la cantidad suficiente de [place] es una preocupacin frecuente [employer]. Puede asegurarse que el beb tiene la leche suficiente si:   El beb succiona activamente y usted escucha que traga.   El beb parece estar relajado y satisfecho despus de Psychologist, clinical.   El nio se alimenta al menos 8 a 12 veces en 24 horas.  Durante los primeros 3 a 5 das de vida:  Moja 3-5 paales en 24 horas. La materia fecal debe ser blanda y [place].  Tiene al menos 3 a 4 deposiciones en 24 horas. La materia fecal debe ser blanda y [place].  A los 5-7 das de vida, el beb debe tener al menos 3-6 deposiciones en 24 horas. La materia fecal debe ser grumosa y [place] a los [address]  [address] de [place].  Su beb tiene una prdida de Psychologist, counselling a 7al 10% durante los primeros 3 [address]  [address] de [address]  El beb no pierde peso despus de 3-7 [address] de [address]  El beb debe aumentar 4 a 6 libras (120 a 170 gr.) por semana despus de los 4 [address] de vida.  Aumenta de [place] a los [address] de vida y vuelve al peso del nacimiento dentro de las 2 semanas. CONGESTIN MAMARIA  Durante la primera semana despus del [place], usted puede experimentar hinchazn en las mamas (congestin [place]). Al estar congestionadas, las mamas se  sienten pesadas, calientes o sensibles al tacto. El pico de la congestin ocurre a las 24 -48 horas despus del parto.   La congestin puede disminuirse:  Continuando con la Tour manager.  Aumentando la frecuencia.  Tomando duchas calientes o aplicando calor hmedo en los senos antes de cada comida. Esto aumenta la circulacin y [place] a que la [place].   Masajeando suavemente el pecho antes y [employer]. Con las yemas de los dedos, masajee la pared del pecho hacia el pezn en un movimiento circular.   Asegurarse de que el beb vaca al menos uno de sus pechos en cada alimentacin. Tambin ayuda si comienza la siguiente toma en el otro seno.   Extraiga manualmente o con un sacaleches las mamas para vaciar los pechos si el beb tiene sueo o no se aliment bien. Tambin puede extraer la [employer] cuando vuelva a trabajar o si siente que se estn congestionando las [place].  Asegrese de que el beb se prende y est bien colocado durante la Market researcher. Si sigue estas indicaciones, la congestin debe mejorar en 24 a 48 horas. Si an tiene dificultades, consulte a Barista.  CUDESE USTED MISMA  Cuide sus mamas.   Bese o dchese diariamente.   Evite usar [employer].   Use un sostn de soporte Evite el uso de sostenes con aro.  Seque al aire sus pezones durante 3-4 minutos despus de cada comida.   Utilice slo apsitos de algodn en el sostn para absorber las prdidas de [place]. La prdida de un poco de [employer]ere [employer] las comidas es normal.   Use solamente lanolina pura en sus pezones despus de Museum/gallery exhibitions officer. Usted no tiene que lavarla antes de alimentar al beb. Otra opcin es sacarse unas gotas de [place] y [employer] pezones.  Continuar con los autocontroles de la mama. Cudese.   Consuma alimentos saludables. Alterne 3 comidas con 3 colaciones.  Evite los alimentos que usted nota que perjudican al beb.  Dixie Dials, jugos  de fruta y agua para Patent examiner su sed (aproximadamente 8 vasos al Futures trader).   Descanse con frecuencia, reljese y tome sus vitaminas prenatales para evitar la fatiga, el estrs y la anemia.  Evite masticar y fumar tabaco.  Evite el consumo de alcohol y drogas.  Tome medicamentos de venta libre y recetados tal como le indic su mdico o Social research officer, government. Siempre debe consultar con su mdico o farmacutico antes de tomar cualquier medicamento, vitamina o suplemento de hierbas.  Sepa que durante la lactancia puede quedar embarazada. Si lo desea, hable con su mdico acerca de la planificacin familiar y los mtodos anticonceptivos seguros que puede utilizar durante la Market researcher. SOLICITE ATENCIN MDICA SI:   Usted siente que quiere dejar de Museum/gallery exhibitions officer o se siente frustrada con la lactancia.  Siente dolor en los senos o en los pezones.  Sus pezones estn agrietados o Water quality scientist.  Sus  pechos estn irritados, sensibles o calientes.  Tiene un rea hinchada en cualquiera de los senos.  Siente escalofros o fiebre.  Tiene nuseas o vmitos.  Observa un drenaje en los pezones.  Sus mamas no se llenan antes de Marine scientist al 5to da despus del [place].  Se siente triste y deprimida.  El nio est demasiado somnoliento como para comer.  El nio tiene problemas para Industrial/product designer.   Moja menos de 3 paales en 24 horas.  Mueve el intestino menos de 3 veces en 24 horas.  La piel del beb o la parte blanca de sus ojos est ms amarilla.   El beb no ha aumentado de [place] a los [address] de [place]. ASEGRESE DE QUE:   Comprende estas instrucciones.  Controlar su enfermedad.  Solicitar ayuda de inmediato si no mejora o si empeora. Document Released: 07/13/2005 Document Revised: 04/06/2012 [facility] Patient Information 2014 [place], [place].

## 2012-12-26 NOTE — Progress Notes (Signed)
FBS 89-101- most fastings are out- start Glyburide at hs 2 hr pp 101-129 If remains transverse-will need version if trying to pursue TOLAC.

## 2012-12-26 NOTE — Progress Notes (Signed)
Fetal position remains transverse, back down, head on maternal Rt.  Next [place] for growth on 6/17 @ [facility]

## 2012-12-26 NOTE — Progress Notes (Signed)
Pulse: 86

## 2012-12-28 ENCOUNTER — Encounter: Payer: Self-pay | Admitting: Family Medicine

## 2012-12-28 LAB — CULTURE, BETA STREP (GROUP B ONLY)

## 2012-12-28 NOTE — Progress Notes (Signed)
NST 12/20/12 reviewed and reactive 

## 2012-12-29 ENCOUNTER — Ambulatory Visit (INDEPENDENT_AMBULATORY_CARE_PROVIDER_SITE_OTHER): Payer: Self-pay | Admitting: *Deleted

## 2012-12-29 VITALS — BP 97/60

## 2012-12-29 DIAGNOSIS — O99810 Abnormal glucose complicating pregnancy: Secondary | ICD-10-CM

## 2012-12-29 DIAGNOSIS — O24419 Gestational diabetes mellitus in pregnancy, unspecified control: Secondary | ICD-10-CM

## 2012-12-29 NOTE — Progress Notes (Signed)
P = 98 

## 2013-01-02 ENCOUNTER — Ambulatory Visit (INDEPENDENT_AMBULATORY_CARE_PROVIDER_SITE_OTHER): Payer: Self-pay | Admitting: Obstetrics & Gynecology

## 2013-01-02 VITALS — BP 111/67 | Wt 178.9 lb

## 2013-01-02 DIAGNOSIS — O09299 Supervision of pregnancy with other poor reproductive or obstetric history, unspecified trimester: Secondary | ICD-10-CM

## 2013-01-02 DIAGNOSIS — O99810 Abnormal glucose complicating pregnancy: Secondary | ICD-10-CM

## 2013-01-02 LAB — POCT URINALYSIS DIP (DEVICE)
Bilirubin Urine: NEGATIVE
Hgb urine dipstick: NEGATIVE
Nitrite: NEGATIVE
pH: 7 (ref 5.0–8.0)

## 2013-01-02 NOTE — Progress Notes (Signed)
P = 86   [place] for growth @ [facility] on 6/17

## 2013-01-02 NOTE — Progress Notes (Signed)
FBS 74-91, pp 85-140, most <128. Continue present dose glyburide. Breech, will consult Dr. Shawnie Pons re possible ECV this week. NST reactive today

## 2013-01-02 NOTE — Patient Instructions (Signed)
Parto de Development worker, community (Breech Delivery) El nacimiento de nalgas ocurre cuando las nalgas o los pies aparecen primero. La [employer] de los bebs tienen la cabeza hacia abajo (vrtex). Muchos bebs se encuentran de nalgas en las primeras etapas del embarazo, pero se dan vuelta colocando la cabeza hacia abajo en el final del embarazo. Existen tres tipos de parto de [place], que influyen de Horticulturist, commercial en los riesgos que el beb pueda sufrir y podra ser necesario practicar una cesrea. Ellas son:  Cuando las nalgas del beb aparecen primero en el canal de parto (vagina) con las piernas hacia arriba y los pies sobre la cabeza del nio [place]).  Cuando las nalgas del beb aparecen primero, con las piernas [address] en las rodillas y los pies [place] abajo , cerca de las nalgas ([place]).  Cuando uno o ambos pies hacia abajo (presentacin de pies). LOS RIESGOS DEL PARTO DE NALGAS:  Prolapso del cordn umbilical. Ocurre cuando el cordn umbilical est frente al beb antes del trabajo de parto o durante el mismo.  Lesiones en los nervios del hombro, brazo y Emergency planning/management officer (lesiones en el plexo braquial) en el momento del [place].  Defectos congnitos, cabeza muy grande (hidrocefalia) y defectos del tubo neural (espina bfida) son los que se observan con ms frecuencia.  Muchas veces el beb es prematuro. Estos son los bebs que nacen antes de [place].  Hay un aumento en el porcentaje de cesreas en los partos de nalgas. CAUSAS DEL EMBARAZO EN EL QUE EL BEB SE ENCUENTRA DE NALGAS:  La madre ya ha tenido varios bebs.  Embarazo de gemelos o ms bebs.  El tero tiene una forma y tamao anormal (tero doble).  El nio pesa menos de 2,5 Kg.  Hay mucho liquido amnitico o no es suficiente.  Hay un tumor en el tero.  La placenta cubre total o parcialmente la abertura del tero (placenta previa). DIAGNSTICO Cuando se acerque la fecha de parto, el mdico le dir si el beb est en posicin de nalgas, realizando  un examen abdominal o vaginal, radiografas o una ecografa o una combinacin de [place]. TRATAMIENTO  Es probable que en la presentacin de nalgas ocurran ms problemas, pero en algunos casos el parto se realiza sin complicaciones sin necesidad de una cesrea. La cesrea tambin presenta riesgos, como una estada ms [employer], formacin de cogulos sanguneos, infecciones o hemorragias. En el parto de nalgas puede comprimirse el cordn umbilical o cortarse el flujo de sangre al beb. Esto puede causar una lesin cerebral o la muerte del nio.  El mdico la ayudar a planificar lo que se har en su caso particular acerca de cmo nacer el beb.  Podr tratar de dar vuelta al nio dentro del tero. Esto se realiza Psychiatric nurse final de un embarazo normal, y en ese momento el mdico colocar ambas manos sobre el abdomen y [place] y lentamente tratar de dar vuelta al [employer]. Este procedimiento se denomina versin ceflica externa (VCE). Si tiene xito y [address] cabeza abajo, es probable que ocurra un parto normal.  Algunos mdicos planificarn una cesrea en este tipo de presentacin. La VCE slo debe intentarse cuando:  El embarazo tiene ms de 36 semanas.  La presentacin de nalgas ha sido confirmada con una ecografa.  Slo debe [employer] sala de partos o [place], con un anestesista, enfermeros de [place] y peditricos y preferiblemente ante la presencia del pediatra. Todo debe estar listo para realizar una cesrea de emergencia, si es  necesario.  Antes de intentar la VCE se le administrar un medicamento para relajar los msculos del tero.  La prueba de no estrs debe ser normal.  El perfil biofsico del nio debe ser normal.  El beb tendr un monitoreo continuo durante este procedimiento.  La aplicacin de una epidural (anestesia regional) ayuda a que la versin sea ms exitosa.  El nio debe ser controlado durante una hora o dos luego del procedimiento. Los  beneficios del VCE son:   Menos riesgos para el nio al nacer por parto de presentacin ceflica.  Menores costos y  Disminucin del porcentaje de cesreas. La VCE no debe realizarse si usted tiene:  Hemorragia vaginal  Diagnstico de placenta previa. En este caso la placenta est cerca o cubre la apertura del tero.  Una prueba de estrs no reactiva del nio.  Un perfil biofsico anormal del nio.  Un beb anormalmente pequeo.  Escasa cantidad de lquido en la bolsa que rodea y protege al beb.  Frecuencia cardaca fetal anormal.  Ruptura prematura de las [place].  El [address] es de mellizos o [place].  Una forma anormal del tero, un tumor o defecto congnito (tero doble). Ms de la mitad de las versiones externas dan buen resultado. An cuando el resultado haya sido bueno, siempre hay una probabilidad de que el nio vuelva a la posicin de [place].  INSTRUCCIONES PARA EL CUIDADO DOMICILIARIO Durante el embarazo:  Concurra regularmente a la consulta con el profesional  [place] las vitaminas indicadas para la etapa prenatal.  Consuma una dieta bien balanceada y practique actividad fsica con regularidad, excepto que le indiquen otra cosa.  Descanse y duerma lo suficiente. Despus de nacido el beb:  Puede tener una pequea hemorragia durante una semana aproximadamente.  Podr sentir algunos clicos en el tero, especialmente si est amamantando.  No tenga relaciones sexuales hasta que el mdico la autorice.  No use tampones ni se haga duchas vaginales.  No tome aspirina, ya que puede causar hemorragias.  Si le han practicado Eustace Quail, podr sentir algn dolor en la incisin y Clinical biochemist.  No levante objetos de ms de 2,5 kg.  Trate de tener ayuda para las tareas domsticas durante 2  3 semanas.  Cumpla con todas las visitas mdicas post parto. SOLICITE ATENCIN MDICA DE INMEDIATO SI PRESENTA: Antes de nacer el beb:  Una  hemorragia vaginal abundante.  Tiene una prdida importante de lquido por la vagina.  Tiene contracciones uterinas.  Su temperatura se eleva por encima de 100 F (37.9 C).  Siente que el nio no se mueve, o lo hace menos que lo habitual. Despus de nacer el beb:  Su temperatura se eleva por encima de 100 F (37.9 C).  Una hemorragia vaginal abundante.  Brett Fairy secrecin vaginal anormal.  Siente dolor al Beatrix Shipper.  Si se siente confundida o desfalleciente.  Le duelen las piernas, siente falta de aire o dolor en el pecho.  Si tuvo una intervencin cesrea y observa enrojecimiento e hinchazn alrededor de la incisin. Observa que sale pus por loa herida adems de todos los problemas ya mencionados. Comunquese con su mdico si est cursando el embarazo, o el beb ya ha nacido y piensa que sufre algn problema. Document Released: 12/30/2007 Document Revised: 10/05/2011 [facility] Patient Information 2014 [place], [place].

## 2013-01-03 ENCOUNTER — Telehealth: Payer: Self-pay | Admitting: *Deleted

## 2013-01-03 NOTE — Telephone Encounter (Signed)
Called pt w/[employer] interpreter # [id number]. I asked her if she would like to have the procedure to turn her baby in proper birthing position which was discussed yesterday. She said yes. I scheduled external cephalic version on 6/12 @ 0800. Pt was informed to arrive @ 0700 and not to eat breakfast.  Pt agreed and voiced understanding.

## 2013-01-03 NOTE — Telephone Encounter (Signed)
Message copied by Jill Side on Tue Jan 03, 2013 12:28 PM ------      Message from: Reva Bores      Created: Tue Jan 03, 2013  8:43 AM       I will do her version, if she desires, can we call and book it for Thursday with me?      ----- Message -----         From: Adam Phenix, MD         Sent: 01/02/2013  10:08 AM           To: Reva Bores, MD            37 weeks breech,is she a candidate for ECV on Thursday when you are on call? Previous CS x1, you saw her last week      Rosanne Ashing       ------

## 2013-01-04 ENCOUNTER — Telehealth (HOSPITAL_COMMUNITY): Payer: Self-pay | Admitting: *Deleted

## 2013-01-04 ENCOUNTER — Encounter (HOSPITAL_COMMUNITY): Payer: Self-pay | Admitting: *Deleted

## 2013-01-04 NOTE — Telephone Encounter (Signed)
Preadmission screen  

## 2013-01-04 NOTE — Telephone Encounter (Signed)
[id number] interpreter number Preadmission screen

## 2013-01-05 ENCOUNTER — Inpatient Hospital Stay (HOSPITAL_COMMUNITY)
Admission: RE | Admit: 2013-01-05 | Discharge: 2013-01-05 | DRG: 781 | Disposition: A | Discharge status: Home / Self Care | Payer: Self-pay | Source: Ambulatory Visit | Attending: Family Medicine | Admitting: Family Medicine

## 2013-01-05 ENCOUNTER — Other Ambulatory Visit: Payer: Self-pay

## 2013-01-05 VITALS — Ht 60.0 in | Wt 180.0 lb

## 2013-01-05 DIAGNOSIS — O321XX0 Maternal care for breech presentation, not applicable or unspecified: Principal | ICD-10-CM | POA: Diagnosis present

## 2013-01-05 DIAGNOSIS — IMO0002 Reserved for concepts with insufficient information to code with codable children: Secondary | ICD-10-CM

## 2013-01-05 DIAGNOSIS — O09293 Supervision of pregnancy with other poor reproductive or obstetric history, third trimester: Secondary | ICD-10-CM

## 2013-01-05 DIAGNOSIS — R7309 Other abnormal glucose: Secondary | ICD-10-CM

## 2013-01-05 DIAGNOSIS — O99810 Abnormal glucose complicating pregnancy: Secondary | ICD-10-CM | POA: Diagnosis present

## 2013-01-05 DIAGNOSIS — O0993 Supervision of high risk pregnancy, unspecified, third trimester: Secondary | ICD-10-CM

## 2013-01-05 DIAGNOSIS — O34219 Maternal care for unspecified type scar from previous cesarean delivery: Secondary | ICD-10-CM | POA: Diagnosis present

## 2013-01-05 DIAGNOSIS — O24419 Gestational diabetes mellitus in pregnancy, unspecified control: Secondary | ICD-10-CM

## 2013-01-05 DIAGNOSIS — O4700 False labor before 37 completed weeks of gestation, unspecified trimester: Secondary | ICD-10-CM | POA: Diagnosis present

## 2013-01-05 MED ORDER — LACTATED RINGERS IV SOLN
INTRAVENOUS | Status: DC
  Administered 2013-01-05: 09:00:00 via INTRAVENOUS

## 2013-01-05 MED ORDER — TERBUTALINE SULFATE 1 MG/ML IJ SOLN
INTRAMUSCULAR | Status: AC
  Administered 2013-01-05: 0.25 mg
  Filled 2013-01-05: qty 1

## 2013-01-05 MED ORDER — TERBUTALINE SULFATE 1 MG/ML IJ SOLN: 0.2500 mg | Freq: Once | INTRAMUSCULAR | Status: DC

## 2013-01-05 NOTE — Discharge Instructions (Signed)
Versin ceflica externa (External Cephalic Version) La versin ceflica externa es dar vuelta a un beb que tiene las nalgas hacia adelante o est de costado en el tero (trasverso) para colocarlo en una posicin de cabeza. Esto agiliza el trabajo de parto y el nacimiento, lo hace ms seguro para la madre y el beb y Sports coach la posibilidad de Warehouse manager que realizar una cesrea. No debera realizarse hasta que el embarazo sea de 36 semanas o ms. ANTES DEL PROCEDIMIENTO  No tome aspirina  No coma durante las cuatro horas previas al procedimiento.  Comente al mdico si tiene resfro, fiebre o una infeccin.  Informe a su mdico si sufre contracciones.  Informe al mdico si tiene una prdida de lquido por la vagina.  Informe al mdico si tiene hemorragia vaginal o secrecin anormal.  Si es admitida el mismo da de la [place], Oceanographer al hospital al menos una hora antes del procedimiento para leer y Oceanographer los formularios y Development worker, community, y Theme park manager preparada.  Consulte con el profesional que lo asiste si ha tenido algn problema con anestsicos anteriormente.  Informe al mdico si ha estado tomado medicamentos. Esto incluye medicamentos de venta libre y con receta, hierbas, gotas oftlmicas y cremas. PROCEDIMIENTO  Primero se realiza un ultrasonido para asegurarse de que el beb est de nalgas o trasverso.  Se realizar una prueba de no estrs o perfil biofsico al beb antes de la VCE. Esto se realiza para asegurarse de que es seguro realizar la VCE al beb. Tambin podr realizarse despus del procedimiento para asegurarse de que el beb est bien.  La VCE se realiza en la sala de parto con la presencia de Heritage manager. Deber haber instrumental para una cesrea de emergencia con un equipo de mdicos disponible.  Se le dar a la paciente medicacin para [employer] msculos uterinos. Se podr administrar una vacuna epidural para cualquier molestia. Esto es til para el xito de la  VCE.  Tambin se coloca un monitor electrnico fetal durante el procedimiento para asegurarse de que el beb est bien.  Si la madre es Rh negativo, se administrar Rho-gam para prevenir problemas de Rh en los embarazos futuros.  La madre quedar en observacin durante 2 a 3 horas despus del procedimiento para asegurarse de que no ha habido problemas. BENEFICIOS DE LA VCE  Trabajo de parto ms fcil y seguro para la madre y para el beb.  Menor incidencia de Copy.  Menores costos por parto vaginal. RIESGOS DE LA VCE  La placenta se desplaza de la pared del tero antes del parto (abrupcin de la placenta).  Ruptura del tero, en especial en pacientes con corte de cesrea previo.  Estrs fetal.  Parto prematuro.  Ruptura prematura de las [place].  El beb puede volver a ponerse de nalgas o trasverso.  Puede ocurrir [employer] fetal, pero esto es muy poco comn. LA VCE DEBER DETENERSE SI:  El ritmo cardaco fetal decae.  La madre siente [employer].  No se puede dar vuelta al beb despus de varios intentos. LA VCE NO DEBER EFECTUARSE SI:  La prueba de no estrs o perfil biofsico es anormal.  Hay hemorragia vaginal.  Forma anormal del tero.  Hay insuficiencia cardaca o presin alta no controlada en la madre.  Embarazo de gemelos o ms bebs.  La placenta cubre la apertura del cuello del tero (placenta previa).  Ha tenido una cesrea anterior con una incisin clsica o ciruga mayor del tero.  Insuficiencia de lquido [employer]  bolsa del beb (oligohidramnios).  El beb es muy pequeo o no se ha desarrollado normalmente (anomala).  Ruptura de [place]. INSTRUCCIONES PARA EL CUIDADO DOMICILIARIO  Pdale a alguna persona que la lleve hasta su domicilio despus del procedimiento.  Haga reposo en su casa durante varias horas.  Haga que alguien permanezca con usted cuando regrese a su casa, al menos durante las primeras horas.  Luego de la VCE,  contine con las actividades prenatales segn se le haya indicado.  Contine con su dieta normal, reposo y [address].  No realice actividades estresantes por un par [employer]. SOLICITE ATENCIN MDICA DE INMEDIATO SI:  Presenta una hemorragia vaginal abundante.  Presenta una secrecin vaginal (la bolsa de agua podra haberse roto).  Tiene contracciones uterinas.  No siente que el beb se mueve, o percibe menos movimientos que antes.  Siente dolor abdominal.  La temperatura oral se eleva por encima de 102 F (38.9 C) o mayor. Document Released: 12/30/2007 Document Revised: 10/05/2011 [facility] Patient Information 2014 [place], [place].

## 2013-01-05 NOTE — Plan of Care (Signed)
Dr. Shawnie Pons on unit to preform ECV. Brought to her attention that pt is Previous C/S with classical incision. Shawnie Pons will review pts records

## 2013-01-05 NOTE — Progress Notes (Signed)
I assisted Chief Executive Officer with translation of questions.

## 2013-01-05 NOTE — H&P (Signed)
Julie Allen is a 22 y.o. female presenting for ECV. Maternal Medical History:  Reason for admission: Nausea. Breech presentation and desires TOLAC for ECV.  Contractions: Frequency: irregular.    Fetal activity: Perceived fetal activity is normal.   Last perceived fetal movement was within the past hour.    Prenatal complications: No bleeding.   H/o pre-eclampsia and previous C-section. DM  Prenatal Complications - Diabetes: gestational. Diabetes is managed by oral agent (monotherapy).      OB History   Grav Para Term Preterm Abortions TAB SAB Ect Mult Living   4 2 0 2 1  1   1      Past Medical History  Diagnosis Date  . Pre-eclampsia/eclampsia with prior hypertension in pregnancy   . Gestational diabetes     glyburide   Past Surgical History  Procedure Laterality Date  . Cesarean section      Classical (vertical)   Family History: family history is negative for Asthma, and Arthritis, and Alcohol abuse, and Birth defects, and COPD, and Cancer, and Depression, and Diabetes, and Drug abuse, and Early death, and Hearing loss, and Heart disease, and Hyperlipidemia, and Hypertension, and Kidney disease, and Learning disabilities, and Mental illness, and Mental retardation, and Vision loss, and Stroke, and Miscarriages / [place], . Social History:  reports that she has never smoked. She has never used smokeless tobacco. She reports that she does not drink alcohol or use illicit drugs.   Prenatal Transfer Tool  Maternal Diabetes: Yes:  Diabetes Type:  Insulin/Medication controlled Genetic Screening: Declined Maternal Ultrasounds/Referrals: Normal Fetal Ultrasounds or other Referrals:  None Maternal Substance Abuse:  No Significant Maternal Medications:  Meds include: Other:  Significant Maternal Lab Results:  Lab values include: Group B Strep negative Other Comments:  None  Review of Systems  Constitutional: Negative for fever and chills.  Respiratory: Negative  for shortness of breath.   Cardiovascular: Negative for chest pain.  Gastrointestinal: Negative for nausea, vomiting and abdominal pain.      Height 5' (1.524 m), weight 180 lb (81.647 kg), last menstrual period 04/10/2012. Maternal Exam:  Abdomen: Surgical scars: supraumbilical midline.   Fetal presentation: breech  Introitus: Amniotic fluid character: not assessed.  Pelvis: adequate for delivery.      Fetal Exam Fetal Monitor Review: Baseline rate: 140.  Variability: moderate (6-25 bpm).    Fetal State Assessment: Category I - tracings are normal.     Physical Exam  Vitals reviewed. Constitutional: She is oriented to person, place, and time. She appears well-developed and well-nourished. No distress.  HENT:  Head: Normocephalic and atraumatic.  Neck: Neck supple.  Cardiovascular: Normal rate.   Respiratory: Effort normal.  GI: Soft. There is no tenderness.  Neurological: She is alert and oriented to person, place, and time.  Skin: Skin is warm and dry.  Psychiatric: She has a normal mood and affect.    Prenatal labs: ABO, Rh: O/POS/-- (01/21 0955) Antibody: NEG (01/21 0955) Rubella: 2.46 (01/21 0955) RPR: NON REAC (05/01 1117)  HBsAg: NEGATIVE (01/21 0955)  HIV: NON REACTIVE (01/21 0955)  GBS: Negative (06/02 0000)   Assessment/Plan: For ECV after informed consent.   Julie Allen S 01/05/2013, 8:29 AM

## 2013-01-05 NOTE — Progress Notes (Signed)
Patient ID: Julie Allen, female   DOB: 02-14-1991, 22 y.o.   MRN: [id number] After informed verbal consent, Terbutaline 0.25 mg SQ given, ECV was attempted under Ultrasound guidance.  Fetus was breech with head to maternal left. Forward roll completed with one attempt.  U/s confirmed vertex following procedure.   FHR was reactive before and after the procedure.   Pt. Tolerated the procedure well.

## 2013-01-09 ENCOUNTER — Ambulatory Visit (INDEPENDENT_AMBULATORY_CARE_PROVIDER_SITE_OTHER): Payer: Self-pay | Admitting: Obstetrics and Gynecology

## 2013-01-09 ENCOUNTER — Other Ambulatory Visit (HOSPITAL_COMMUNITY): Payer: Self-pay | Admitting: Obstetrics and Gynecology

## 2013-01-09 VITALS — BP 116/71 | Temp 97.4°F | Wt 180.3 lb

## 2013-01-09 DIAGNOSIS — O0993 Supervision of high risk pregnancy, unspecified, third trimester: Secondary | ICD-10-CM

## 2013-01-09 DIAGNOSIS — O24419 Gestational diabetes mellitus in pregnancy, unspecified control: Secondary | ICD-10-CM

## 2013-01-09 DIAGNOSIS — IMO0002 Reserved for concepts with insufficient information to code with codable children: Secondary | ICD-10-CM

## 2013-01-09 DIAGNOSIS — O321XX0 Maternal care for breech presentation, not applicable or unspecified: Secondary | ICD-10-CM

## 2013-01-09 DIAGNOSIS — O99810 Abnormal glucose complicating pregnancy: Secondary | ICD-10-CM

## 2013-01-09 DIAGNOSIS — O99013 Anemia complicating pregnancy, third trimester: Secondary | ICD-10-CM

## 2013-01-09 DIAGNOSIS — R6889 Other general symptoms and signs: Secondary | ICD-10-CM

## 2013-01-09 DIAGNOSIS — O09293 Supervision of pregnancy with other poor reproductive or obstetric history, third trimester: Secondary | ICD-10-CM

## 2013-01-09 DIAGNOSIS — O99019 Anemia complicating pregnancy, unspecified trimester: Secondary | ICD-10-CM

## 2013-01-09 DIAGNOSIS — O09299 Supervision of pregnancy with other poor reproductive or obstetric history, unspecified trimester: Secondary | ICD-10-CM

## 2013-01-09 DIAGNOSIS — Z3490 Encounter for supervision of normal pregnancy, unspecified, unspecified trimester: Secondary | ICD-10-CM

## 2013-01-09 LAB — POCT URINALYSIS DIP (DEVICE)
Nitrite: NEGATIVE
Protein, ur: NEGATIVE mg/dL
Urobilinogen, UA: 0.2 mg/dL (ref 0.0–1.0)
pH: 7 (ref 5.0–8.0)

## 2013-01-09 NOTE — Progress Notes (Signed)
K[place] for growth/BPP @ MFM on 6/17.  IOL scheduled 01/16/13 @ 0730.   Informal [place] to confirm vertex presentation- still vertex.

## 2013-01-09 NOTE — Progress Notes (Signed)
Patient doing well without complaints. CBG's all within range. F/U growth ultrasound tomorrow. Will schedule IOL next Monday. NST reviewed and reactive

## 2013-01-09 NOTE — Progress Notes (Signed)
Pulse- 100  Pain/pressure- when baby moves

## 2013-01-10 ENCOUNTER — Ambulatory Visit (HOSPITAL_COMMUNITY)
Admission: RE | Admit: 2013-01-10 | Discharge: 2013-01-10 | Disposition: A | Discharge status: Home / Self Care | Payer: Self-pay | Source: Ambulatory Visit | Attending: Family Medicine | Admitting: Family Medicine

## 2013-01-10 ENCOUNTER — Encounter (HOSPITAL_COMMUNITY): Payer: Self-pay | Admitting: *Deleted

## 2013-01-10 ENCOUNTER — Encounter (HOSPITAL_COMMUNITY): Payer: Self-pay

## 2013-01-10 ENCOUNTER — Telehealth (HOSPITAL_COMMUNITY): Payer: Self-pay | Admitting: *Deleted

## 2013-01-10 DIAGNOSIS — O34219 Maternal care for unspecified type scar from previous cesarean delivery: Secondary | ICD-10-CM | POA: Insufficient documentation

## 2013-01-10 DIAGNOSIS — O09299 Supervision of pregnancy with other poor reproductive or obstetric history, unspecified trimester: Secondary | ICD-10-CM | POA: Insufficient documentation

## 2013-01-10 DIAGNOSIS — O24419 Gestational diabetes mellitus in pregnancy, unspecified control: Secondary | ICD-10-CM

## 2013-01-10 DIAGNOSIS — O99810 Abnormal glucose complicating pregnancy: Secondary | ICD-10-CM | POA: Insufficient documentation

## 2013-01-10 NOTE — Telephone Encounter (Signed)
Preadmission screen  

## 2013-01-10 NOTE — Progress Notes (Signed)
[facility] ultrasound  Indication: 22 yr old [id number] at [redacted]w[redacted]d by anatomy survey ultrasound dating with gestational diabetes A1 for follow up fetal growth. Previous pregnancy with 28 week fetal demise, history of preeclampsia.  Findings:  1. Single intrauterine pregnancy. 2. Estimated fetal weight is in the 76th%. The long bones lag by 3 weeks. The long bones are normal in shape and echogenicity. 3. Posterior placenta without evidence of previa. 4. Normal amniotic fluid index. 5. The limited anatomy survey is normal.  6. Fetus is in cephalic presentation. 7. Normal biophysical profile of 8/8.  Recommendations: 1. Appropriate interval growth. 2. Normal limited anatomy survey. 3. History of fetal demise: - no consult requested - recommend fetal growth ultrasounds every 4 weeks - recommend antenatal testing; normal BPP today - recommend delivery by estimated due date but not prior to 39 weeks in the absence of other complications or without amniocentesis for fetal lung maturity- also see below regarding delivery timing 4. History of preeclampsia: - no consult requested - recommend close surveillance for the development of signs/symptoms of preeclampsia 5. Lagging long bones: - likely constitutional - also difficult to determine degree given uncertainty in dating - inform Pediatrics at delivery - bones appear normal in shape and echogenicity 6. Gestational diabetes: - no consult requested - diet controlled 7. Previous C section: - patient is unsure if was classical C section - if strong suspicion for classical C section recommend delivery via repeat C section at 37 weeks otherwise may have trial of labor with continuous monitoring; patient reports she was told in [place] she could have a future vaginal delivery; reviewed risks of uterine rupture, fetal morbidity and mortality, and need for emergent C section and hysterectomy - patient counseled by primary OB - is opting  for trial of labor - recommend continuous fetal monitoring and monitoring for signs/symptoms of uterine rupture  Eulis Foster, MD

## 2013-01-11 ENCOUNTER — Encounter: Payer: Self-pay | Admitting: Obstetrics and Gynecology

## 2013-01-11 ENCOUNTER — Encounter: Payer: Self-pay | Admitting: *Deleted

## 2013-01-16 ENCOUNTER — Inpatient Hospital Stay (HOSPITAL_COMMUNITY)
Admission: RE | Admit: 2013-01-16 | Discharge: 2013-01-21 | DRG: 766 | Disposition: A | Discharge status: Home / Self Care | Payer: Medicaid Other | Source: Ambulatory Visit | Attending: Family Medicine | Admitting: Family Medicine

## 2013-01-16 ENCOUNTER — Encounter (HOSPITAL_COMMUNITY): Payer: Self-pay

## 2013-01-16 VITALS — BP 106/68 | HR 89 | Temp 97.9°F | Resp 18 | Ht 60.0 in | Wt 180.0 lb

## 2013-01-16 DIAGNOSIS — Z98891 History of uterine scar from previous surgery: Secondary | ICD-10-CM

## 2013-01-16 DIAGNOSIS — D6489 Other specified anemias: Secondary | ICD-10-CM | POA: Diagnosis not present

## 2013-01-16 DIAGNOSIS — IMO0002 Reserved for concepts with insufficient information to code with codable children: Secondary | ICD-10-CM

## 2013-01-16 DIAGNOSIS — O324XX0 Maternal care for high head at term, not applicable or unspecified: Secondary | ICD-10-CM | POA: Diagnosis present

## 2013-01-16 DIAGNOSIS — O34219 Maternal care for unspecified type scar from previous cesarean delivery: Secondary | ICD-10-CM

## 2013-01-16 DIAGNOSIS — R7309 Other abnormal glucose: Secondary | ICD-10-CM

## 2013-01-16 DIAGNOSIS — O9903 Anemia complicating the puerperium: Secondary | ICD-10-CM | POA: Diagnosis not present

## 2013-01-16 DIAGNOSIS — O24419 Gestational diabetes mellitus in pregnancy, unspecified control: Secondary | ICD-10-CM

## 2013-01-16 DIAGNOSIS — O99814 Abnormal glucose complicating childbirth: Secondary | ICD-10-CM | POA: Diagnosis present

## 2013-01-16 DIAGNOSIS — O321XX0 Maternal care for breech presentation, not applicable or unspecified: Secondary | ICD-10-CM | POA: Diagnosis present

## 2013-01-16 LAB — GLUCOSE, CAPILLARY
Glucose-Capillary: 159 mg/dL — ABNORMAL HIGH (ref 70–99)
Glucose-Capillary: 124 mg/dL — ABNORMAL HIGH (ref 70–99)
Glucose-Capillary: 80 mg/dL (ref 70–99)

## 2013-01-16 LAB — PREPARE RBC (CROSSMATCH)

## 2013-01-16 LAB — CBC
MCH: 22.6 pg — ABNORMAL LOW (ref 26.0–34.0)
MCHC: 30.7 g/dL (ref 30.0–36.0)
MCV: 73.8 fL — ABNORMAL LOW (ref 78.0–100.0)
Platelets: 318 10*3/uL (ref 150–400)
RDW: 17.6 % — ABNORMAL HIGH (ref 11.5–15.5)

## 2013-01-16 LAB — ABO/RH: ABO/RH(D): O POS

## 2013-01-16 MED ORDER — CITRIC ACID-SODIUM CITRATE 334-500 MG/5ML PO SOLN
30.0000 mL | ORAL | Status: DC | PRN
  Administered 2013-01-18: 30 mL via ORAL
  Filled 2013-01-16: qty 15

## 2013-01-16 MED ORDER — IBUPROFEN 600 MG PO TABS: 600.0000 mg | ORAL_TABLET | Freq: Four times a day (QID) | ORAL | Status: DC | PRN

## 2013-01-16 MED ORDER — LIDOCAINE HCL (PF) 1 % IJ SOLN: 30.0000 mL | INTRAMUSCULAR | Status: DC | PRN

## 2013-01-16 MED ORDER — LACTATED RINGERS IV SOLN
INTRAVENOUS | Status: DC
  Administered 2013-01-16 – 2013-01-17 (×5): via INTRAVENOUS
  Administered 2013-01-17: 125 mL/h via INTRAVENOUS
  Administered 2013-01-18 (×7): via INTRAVENOUS

## 2013-01-16 MED ORDER — OXYTOCIN BOLUS FROM INFUSION: 500.0000 mL | INTRAVENOUS | Status: DC

## 2013-01-16 MED ORDER — ONDANSETRON HCL 4 MG/2ML IJ SOLN: 4.0000 mg | Freq: Four times a day (QID) | INTRAMUSCULAR | Status: DC | PRN

## 2013-01-16 MED ORDER — OXYCODONE-ACETAMINOPHEN 5-325 MG PO TABS: 1.0000 | ORAL_TABLET | ORAL | Status: DC | PRN

## 2013-01-16 MED ORDER — LACTATED RINGERS IV SOLN
500.0000 mL | INTRAVENOUS | Status: DC | PRN
  Administered 2013-01-16 – 2013-01-17 (×2): 500 mL via INTRAVENOUS
  Administered 2013-01-18: 300 mL via INTRAVENOUS

## 2013-01-16 MED ORDER — ACETAMINOPHEN 325 MG PO TABS: 650.0000 mg | ORAL_TABLET | ORAL | Status: DC | PRN

## 2013-01-16 MED ORDER — OXYTOCIN 40 UNITS IN LACTATED RINGERS INFUSION - SIMPLE MED
62.5000 mL/h | INTRAVENOUS | Status: DC
  Filled 2013-01-16 (×2): qty 1000

## 2013-01-16 NOTE — Progress Notes (Signed)
Dr. Aviva Signs notified regarding patient's admittance to the hospital.

## 2013-01-16 NOTE — H&P (Signed)
Julie Allen is a 22 y.o. female presenting for IOL for gestational diabetes (A2)/glyburide. History OB History   Grav Para Term Preterm Abortions TAB SAB Ect Mult Living   4 2 0 2 1  1   1      Past Medical History  Diagnosis Date  . Pre-eclampsia/eclampsia with prior hypertension in pregnancy   . Gestational diabetes     glyburide   Past Surgical History  Procedure Laterality Date  . Cesarean section      vertical skin incision   Family History: family history is negative for Asthma, and Arthritis, and Alcohol abuse, and Birth defects, and COPD, and Cancer, and Depression, and Diabetes, and Drug abuse, and Early death, and Hearing loss, and Heart disease, and Hyperlipidemia, and Hypertension, and Kidney disease, and Learning disabilities, and Mental illness, and Mental retardation, and Vision loss, and Stroke, and Miscarriages / [place], . Social History:  reports that she has never smoked. She has never used smokeless tobacco. She reports that she does not drink alcohol or use illicit drugs.   Prenatal Transfer Tool  Maternal Diabetes: Yes:  Diabetes Type:  Insulin/Medication controlled Genetic Screening: Declined Maternal Ultrasounds/Referrals: Normal Fetal Ultrasounds or other Referrals:  None Maternal Substance Abuse:  No Significant Maternal Medications:  Meds include: Other: Glyburide Significant Maternal Lab Results:  Lab values include: Group B Strep negative Other Comments:  Lagging long bones  Review of Systems  Gastrointestinal: Positive for abdominal pain (occasional cramping).  All other systems reviewed and are negative.    Dilation: Closed Exam by:: W. Muhammed Blood pressure 117/70, pulse 91, temperature 97.9 F (36.6 C), temperature source Oral, resp. rate 20, height 5' (1.524 m), weight 81.647 kg (180 lb), last menstrual period 04/10/2012. Maternal Exam:  Uterine Assessment: Contraction strength is mild.  Contraction frequency is rare.    Abdomen: Surgical scars: low vertical midline.   Estimated fetal weight is 6-6.5.   Fetal presentation: vertex  Introitus: Vagina is positive for vaginal discharge (mucusy).    Fetal Exam Fetal Monitor Review: Baseline rate: 150's.  Variability: moderate (6-25 bpm).   Pattern: accelerations present.    Fetal State Assessment: Category I - tracings are normal.     Physical Exam  Constitutional: She is oriented to person, place, and time. She appears well-developed and well-nourished. No distress.  HENT:  Head: Normocephalic.  Neck: Normal range of motion. Neck supple.  Cardiovascular: Normal rate, regular rhythm and normal heart sounds.   Respiratory: Effort normal and breath sounds normal.  GI: Soft. There is no tenderness.  Genitourinary: No bleeding around the vagina. Vaginal discharge (mucusy) found.  Musculoskeletal: Normal range of motion. She exhibits no edema.  Neurological: She is alert and oriented to person, place, and time.  Skin: Skin is warm and dry.    Prenatal labs: ABO, Rh: O/POS/-- (01/21 0955) Antibody: NEG (01/21 0955) Rubella: 2.46 (01/21 0955) RPR: NON REAC (05/01 1117)  HBsAg: NEGATIVE (01/21 0955)  HIV: NON REACTIVE (01/21 0955)  GBS: Negative (06/02 0000)   Assessment/Plan: [id number] at 39.0 wks IUP - Dated by 2nd Trimester ultrasound in Dr. Elsie Stain office Induction of Labor - Gestational Diabetes/A2 GBS negative Trial of Labor after C-Section  Plan: Admit to Birthing Suites Foley bulb inserted without difficulty. Check CBG every 4 hours until active labor.   [facility] 01/16/2013, 10:43 AM

## 2013-01-16 NOTE — H&P (Signed)
Chart reviewed and agree with management and plan.  

## 2013-01-16 NOTE — Progress Notes (Signed)
Helped Psychologist, clinical  With questions.

## 2013-01-16 NOTE — Progress Notes (Signed)
Assisted [facility] CNM with plan of care
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
## 2013-01-16 NOTE — Progress Notes (Addendum)
Julie Allen is a 22 y.o. [id number] at [redacted]w[redacted]d admitted for IOL secondary to Gestational Diabetes (A2) on Glyburide.  Subjective: Pt reports feeling well. No painful contractions yet. Foley bulb still in place.   Objective: BP 113/65  Pulse 96  Temp(Src) 98.3 F (36.8 C) (Oral)  Resp 20  Ht 5' (1.524 m)  Wt 81.647 kg (180 lb)  BMI 35.15 kg/m2  LMP 04/10/2012     FHT:  FHR: 150 bpm, variability: moderate,  accelerations:  Present,  decelerations:  Absent UC:   irregular, every 7-10 minutes SVE:   Dilation: Closed Exam by:: Julie Allen  Labs: Lab Results  Component Value Date   WBC 6.0 01/16/2013   HGB 10.1* 01/16/2013   HCT 32.9* 01/16/2013   MCV 73.8* 01/16/2013   PLT 318 01/16/2013    Assessment / Plan: Induction of labor due to gestational diabetes  Labor: Progressing normally foley bulb in place Fetal Wellbeing:  Category II Pain Control:  pt is considering epidural  Anticipated MOD:  NSVD  Julie Allen, Julie Allen 01/16/2013, 5:08 PM

## 2013-01-17 ENCOUNTER — Encounter (HOSPITAL_COMMUNITY): Payer: Self-pay

## 2013-01-17 DIAGNOSIS — O321XX0 Maternal care for breech presentation, not applicable or unspecified: Secondary | ICD-10-CM

## 2013-01-17 LAB — GLUCOSE, CAPILLARY
Glucose-Capillary: 83 mg/dL (ref 70–99)
Glucose-Capillary: 96 mg/dL (ref 70–99)

## 2013-01-17 MED ORDER — OXYTOCIN 40 UNITS IN LACTATED RINGERS INFUSION - SIMPLE MED
1.0000 m[IU]/min | INTRAVENOUS | Status: AC
  Administered 2013-01-17: 6 m[IU]/min via INTRAVENOUS
  Administered 2013-01-17: 1 m[IU]/min via INTRAVENOUS
  Filled 2013-01-17: qty 1000

## 2013-01-17 MED ORDER — PHENYLEPHRINE 40 MCG/ML (10ML) SYRINGE FOR IV PUSH (FOR BLOOD PRESSURE SUPPORT): 80.0000 ug | PREFILLED_SYRINGE | INTRAVENOUS | Status: DC | PRN

## 2013-01-17 MED ORDER — FENTANYL 2.5 MCG/ML BUPIVACAINE 1/10 % EPIDURAL INFUSION (WH - ANES): 14.0000 mL/h | INTRAMUSCULAR | Status: DC | PRN

## 2013-01-17 MED ORDER — EPHEDRINE 5 MG/ML INJ: 10.0000 mg | INTRAVENOUS | Status: DC | PRN

## 2013-01-17 MED ORDER — TERBUTALINE SULFATE 1 MG/ML IJ SOLN: 0.2500 mg | Freq: Once | INTRAMUSCULAR | Status: AC | PRN

## 2013-01-17 MED ORDER — BUTORPHANOL TARTRATE 1 MG/ML IJ SOLN: 1.0000 mg | INTRAMUSCULAR | Status: DC | PRN

## 2013-01-17 MED ORDER — DIPHENHYDRAMINE HCL 50 MG/ML IJ SOLN: 12.5000 mg | INTRAMUSCULAR | Status: DC | PRN

## 2013-01-17 MED ORDER — LACTATED RINGERS IV SOLN: 500.0000 mL | Freq: Once | INTRAVENOUS | Status: DC

## 2013-01-17 NOTE — Progress Notes (Signed)
  Julie Allen is a 22 y.o. [id number] at [redacted]w[redacted]d admitted for IOL secondary to Gestational Diabetes (A2). TOLAC.  Subjective: Comfortable with contractions.  No complaints.  Objective: BP 118/72  Pulse 99  Temp(Src) 98.1 F (36.7 C) (Oral)  Resp 18  Ht 5' (1.524 m)  Wt 180 lb (81.647 kg)  BMI 35.15 kg/m2  LMP 04/10/2012   FHT: FHR: 150 bpm, variability: moderate, accelerations: Present, decelerations: Absent  UC: every 3-5 minutes SVE:   Dilation: 4.5 Effacement (%): 70 Station: -2;-3 Exam by:: Muhammad CNM Bedside U/S : Cephalic presentation  Labs: Lab Results  Component Value Date   WBC 6.0 01/16/2013   HGB 10.1* 01/16/2013   HCT 32.9* 01/16/2013   MCV 73.8* 01/16/2013   PLT 318 01/16/2013    Assessment / Plan: Induction of labor due to gestational diabetes, on pitocin. TOLAC.  Labor: Still not in labor yet, will give a little break for patient to eat and walk around then resume pitocin per protocol.  Cephalic presentation confirmed with bedside ultrasound Fetal Wellbeing:  Category I Pain Control:   IV pain medications as needed and considering epidural later Anticipated MOD:  Hopeful for VBAC  Tereso Newcomer, M.D. 01/17/2013, 9:10 PM

## 2013-01-17 NOTE — Progress Notes (Signed)
Julie Allen is a 22 y.o. [id number] at [redacted]w[redacted]d admitted for IOL secondary to Gestational Diabetes (A2).  Subjective: Comfortable with contractions. No complaints.  Objective: BP 104/66  Pulse 98  Temp(Src) 97.5 F (36.4 C) (Oral)  Resp 18  Ht 5' (1.524 m)  Wt 81.647 kg (180 lb)  BMI 35.15 kg/m2  LMP 04/10/2012      FHT:  FHR: 150 bpm, variability: moderate,  accelerations:  Present,  decelerations:  Absent UC:   every 3-5 minutes SVE:   Dilation: 4.5 Effacement (%): 70 Station: -2;-3 Exam by:: Adriana Simas MD  Labs: Lab Results  Component Value Date   WBC 6.0 01/16/2013   HGB 10.1* 01/16/2013   HCT 32.9* 01/16/2013   MCV 73.8* 01/16/2013   PLT 318 01/16/2013    Assessment / Plan: Induction of labor due to gestational diabetes S/P foley bulb On pitocin  Labor: Has not made much change on pitocin; will plan for rest period of pitocin tonight (8 pm). Fetal Wellbeing:  Category I Pain Control:  Considering epidural  Anticipated MOD:  NSVD  Everlene Other 01/17/2013, 5:10 PM

## 2013-01-17 NOTE — Progress Notes (Signed)
Assisted FP with explanation of plan of care. 

## 2013-01-17 NOTE — Progress Notes (Signed)
Dr. Shawnie Pons explained to Julie Allen and gave the Julie a choice to do a c-section delivery or external cephalic version and apply an abdominal binder.  Eda, spanish interpreter at bedside for explanation.  Julie chose for Dr. Shawnie Pons to perform a version and consent signed.  Will continue to monitor.

## 2013-01-17 NOTE — Progress Notes (Signed)
Dr. Thad Ranger at bedside and explained POC to pt and possible c-section delivery due to the transverse presentation.  Eda, spanish interpreter at bedside and pt verbalized understandings.  Will continue to monitor.

## 2013-01-17 NOTE — Progress Notes (Signed)
NST 12-29-12 reviewed and reactive 

## 2013-01-17 NOTE — Progress Notes (Signed)
Patient ID: Julie Allen, female   DOB: 16-Aug-1990, 22 y.o.   MRN: [id number] After informed verbal consent, ECV was attempted under Ultrasound guidance.  Baby was transverse.  Successful conversion via forward roll.  Abdominal binder placed.  Vertex confirmed with VE and U/S.   FHR was reactive before and after the procedure.   Pt. Tolerated the procedure well.

## 2013-01-18 ENCOUNTER — Inpatient Hospital Stay (HOSPITAL_COMMUNITY): Payer: Medicaid Other | Admitting: Anesthesiology

## 2013-01-18 ENCOUNTER — Encounter (HOSPITAL_COMMUNITY)
Admission: RE | Disposition: A | Discharge status: Home / Self Care | Payer: Self-pay | Source: Ambulatory Visit | Attending: Family Medicine

## 2013-01-18 ENCOUNTER — Encounter (HOSPITAL_COMMUNITY): Payer: Self-pay | Admitting: Anesthesiology

## 2013-01-18 ENCOUNTER — Encounter (HOSPITAL_COMMUNITY): Payer: Self-pay

## 2013-01-18 DIAGNOSIS — D6489 Other specified anemias: Secondary | ICD-10-CM

## 2013-01-18 DIAGNOSIS — O324XX0 Maternal care for high head at term, not applicable or unspecified: Secondary | ICD-10-CM

## 2013-01-18 DIAGNOSIS — O99814 Abnormal glucose complicating childbirth: Secondary | ICD-10-CM

## 2013-01-18 LAB — GLUCOSE, CAPILLARY
Glucose-Capillary: 115 mg/dL — ABNORMAL HIGH (ref 70–99)
Glucose-Capillary: 71 mg/dL (ref 70–99)
Glucose-Capillary: 78 mg/dL (ref 70–99)
Glucose-Capillary: 78 mg/dL (ref 70–99)
Glucose-Capillary: 81 mg/dL (ref 70–99)

## 2013-01-18 SURGERY — Surgical Case
Anesthesia: Spinal | Site: Abdomen | Wound class: Clean Contaminated

## 2013-01-18 MED ORDER — PHENYLEPHRINE 40 MCG/ML (10ML) SYRINGE FOR IV PUSH (FOR BLOOD PRESSURE SUPPORT)
PREFILLED_SYRINGE | INTRAVENOUS | Status: AC
  Filled 2013-01-18: qty 5

## 2013-01-18 MED ORDER — MEPERIDINE HCL 25 MG/ML IJ SOLN: 6.2500 mg | INTRAMUSCULAR | Status: DC | PRN

## 2013-01-18 MED ORDER — MENTHOL 3 MG MT LOZG: 1.0000 | LOZENGE | OROMUCOSAL | Status: DC | PRN

## 2013-01-18 MED ORDER — TERBUTALINE SULFATE 1 MG/ML IJ SOLN: 0.2500 mg | Freq: Once | INTRAMUSCULAR | Status: DC | PRN

## 2013-01-18 MED ORDER — OXYTOCIN 10 UNIT/ML IJ SOLN
40.0000 [IU] | INTRAVENOUS | Status: DC | PRN
  Administered 2013-01-18: 40 [IU] via INTRAVENOUS

## 2013-01-18 MED ORDER — SODIUM CHLORIDE 0.9 % IJ SOLN
3.0000 mL | INTRAMUSCULAR | Status: DC | PRN
  Administered 2013-01-20: 3 mL via INTRAVENOUS

## 2013-01-18 MED ORDER — LACTATED RINGERS IV SOLN
INTRAVENOUS | Status: DC
  Administered 2013-01-19: 06:00:00 via INTRAVENOUS

## 2013-01-18 MED ORDER — FENTANYL CITRATE 0.05 MG/ML IJ SOLN
INTRAMUSCULAR | Status: DC | PRN
  Administered 2013-01-18: 25 ug via INTRATHECAL

## 2013-01-18 MED ORDER — DIBUCAINE 1 % RE OINT: 1.0000 "application " | TOPICAL_OINTMENT | RECTAL | Status: DC | PRN

## 2013-01-18 MED ORDER — NALOXONE HCL 0.4 MG/ML IJ SOLN: 0.4000 mg | INTRAMUSCULAR | Status: DC | PRN

## 2013-01-18 MED ORDER — NALBUPHINE SYRINGE 5 MG/0.5 ML
5.0000 mg | INJECTION | INTRAMUSCULAR | Status: DC | PRN
  Filled 2013-01-18: qty 1

## 2013-01-18 MED ORDER — IBUPROFEN 600 MG PO TABS
600.0000 mg | ORAL_TABLET | Freq: Four times a day (QID) | ORAL | Status: DC
Start: 2013-01-19 — End: 2013-01-21
  Administered 2013-01-19 – 2013-01-21 (×10): 600 mg via ORAL
  Filled 2013-01-18 (×9): qty 1

## 2013-01-18 MED ORDER — SIMETHICONE 80 MG PO CHEW: 80.0000 mg | CHEWABLE_TABLET | ORAL | Status: DC | PRN

## 2013-01-18 MED ORDER — SENNOSIDES-DOCUSATE SODIUM 8.6-50 MG PO TABS
2.0000 | ORAL_TABLET | Freq: Every day | ORAL | Status: DC
Start: 2013-01-18 — End: 2013-01-21
  Administered 2013-01-19 – 2013-01-20 (×2): 2 via ORAL

## 2013-01-18 MED ORDER — FENTANYL CITRATE 0.05 MG/ML IJ SOLN: 25.0000 ug | INTRAMUSCULAR | Status: DC | PRN

## 2013-01-18 MED ORDER — LACTATED RINGERS IV SOLN
INTRAVENOUS | Status: DC | PRN
  Administered 2013-01-18: 21:00:00 via INTRAVENOUS

## 2013-01-18 MED ORDER — FENTANYL CITRATE 0.05 MG/ML IJ SOLN
INTRAMUSCULAR | Status: AC
  Filled 2013-01-18: qty 2

## 2013-01-18 MED ORDER — MORPHINE SULFATE 0.5 MG/ML IJ SOLN
INTRAMUSCULAR | Status: AC
  Filled 2013-01-18: qty 10

## 2013-01-18 MED ORDER — MORPHINE SULFATE (PF) 0.5 MG/ML IJ SOLN
INTRAMUSCULAR | Status: DC | PRN
  Administered 2013-01-18: .15 mg via INTRATHECAL

## 2013-01-18 MED ORDER — ONDANSETRON HCL 4 MG/2ML IJ SOLN
INTRAMUSCULAR | Status: AC
  Filled 2013-01-18: qty 2

## 2013-01-18 MED ORDER — CEFAZOLIN SODIUM-DEXTROSE 2-3 GM-% IV SOLR
2.0000 g | Freq: Once | INTRAVENOUS | Status: AC
  Administered 2013-01-18: 2 g via INTRAVENOUS
  Filled 2013-01-18: qty 50

## 2013-01-18 MED ORDER — SCOPOLAMINE 1 MG/3DAYS TD PT72
1.0000 | MEDICATED_PATCH | Freq: Once | TRANSDERMAL | Status: DC
  Administered 2013-01-18: 1.5 mg via TRANSDERMAL

## 2013-01-18 MED ORDER — PHENYLEPHRINE HCL 10 MG/ML IJ SOLN
INTRAMUSCULAR | Status: DC | PRN
  Administered 2013-01-18 (×3): 80 ug via INTRAVENOUS

## 2013-01-18 MED ORDER — OXYTOCIN 10 UNIT/ML IJ SOLN
INTRAMUSCULAR | Status: AC
  Filled 2013-01-18: qty 4

## 2013-01-18 MED ORDER — SIMETHICONE 80 MG PO CHEW
80.0000 mg | CHEWABLE_TABLET | Freq: Three times a day (TID) | ORAL | Status: DC
  Administered 2013-01-19 – 2013-01-21 (×7): 80 mg via ORAL

## 2013-01-18 MED ORDER — ACETAMINOPHEN 10 MG/ML IV SOLN
1000.0000 mg | Freq: Four times a day (QID) | INTRAVENOUS | Status: AC | PRN
  Filled 2013-01-18: qty 100

## 2013-01-18 MED ORDER — PROMETHAZINE HCL 25 MG/ML IJ SOLN: 6.2500 mg | INTRAMUSCULAR | Status: DC | PRN

## 2013-01-18 MED ORDER — ONDANSETRON HCL 4 MG/2ML IJ SOLN: 4.0000 mg | INTRAMUSCULAR | Status: DC | PRN

## 2013-01-18 MED ORDER — OXYTOCIN 40 UNITS IN LACTATED RINGERS INFUSION - SIMPLE MED: 62.5000 mL/h | INTRAVENOUS | Status: AC

## 2013-01-18 MED ORDER — TETANUS-DIPHTH-ACELL PERTUSSIS 5-2.5-18.5 LF-MCG/0.5 IM SUSP: 0.5000 mL | Freq: Once | INTRAMUSCULAR | Status: DC

## 2013-01-18 MED ORDER — ONDANSETRON HCL 4 MG/2ML IJ SOLN
INTRAMUSCULAR | Status: DC | PRN
  Administered 2013-01-18: 4 mg via INTRAVENOUS

## 2013-01-18 MED ORDER — DIPHENHYDRAMINE HCL 50 MG/ML IJ SOLN: 12.5000 mg | INTRAMUSCULAR | Status: DC | PRN

## 2013-01-18 MED ORDER — ONDANSETRON HCL 4 MG/2ML IJ SOLN: 4.0000 mg | Freq: Three times a day (TID) | INTRAMUSCULAR | Status: DC | PRN

## 2013-01-18 MED ORDER — ONDANSETRON HCL 4 MG PO TABS: 4.0000 mg | ORAL_TABLET | ORAL | Status: DC | PRN

## 2013-01-18 MED ORDER — DIPHENHYDRAMINE HCL 25 MG PO CAPS: 25.0000 mg | ORAL_CAPSULE | ORAL | Status: DC | PRN

## 2013-01-18 MED ORDER — OXYCODONE-ACETAMINOPHEN 5-325 MG PO TABS
1.0000 | ORAL_TABLET | ORAL | Status: DC | PRN
  Administered 2013-01-19: 1 via ORAL
  Administered 2013-01-19: 2 via ORAL
  Administered 2013-01-20 – 2013-01-21 (×2): 1 via ORAL
  Filled 2013-01-18 (×2): qty 1
  Filled 2013-01-18: qty 2
  Filled 2013-01-18: qty 1

## 2013-01-18 MED ORDER — KETOROLAC TROMETHAMINE 30 MG/ML IJ SOLN
30.0000 mg | Freq: Four times a day (QID) | INTRAMUSCULAR | Status: AC | PRN
  Filled 2013-01-18: qty 1

## 2013-01-18 MED ORDER — METOCLOPRAMIDE HCL 5 MG/ML IJ SOLN: 10.0000 mg | Freq: Three times a day (TID) | INTRAMUSCULAR | Status: DC | PRN

## 2013-01-18 MED ORDER — KETOROLAC TROMETHAMINE 30 MG/ML IJ SOLN
30.0000 mg | Freq: Four times a day (QID) | INTRAMUSCULAR | Status: AC | PRN
  Administered 2013-01-19: 30 mg via INTRAMUSCULAR

## 2013-01-18 MED ORDER — OXYTOCIN 40 UNITS IN LACTATED RINGERS INFUSION - SIMPLE MED
1.0000 m[IU]/min | INTRAVENOUS | Status: DC
  Administered 2013-01-18: 28 m[IU]/min via INTRAVENOUS
  Administered 2013-01-18: 1 m[IU]/min via INTRAVENOUS

## 2013-01-18 MED ORDER — PRENATAL MULTIVITAMIN CH
1.0000 | ORAL_TABLET | Freq: Every day | ORAL | Status: DC
  Administered 2013-01-19 – 2013-01-21 (×3): 1 via ORAL
  Filled 2013-01-18 (×3): qty 1

## 2013-01-18 MED ORDER — DIPHENHYDRAMINE HCL 50 MG/ML IJ SOLN: 25.0000 mg | INTRAMUSCULAR | Status: DC | PRN

## 2013-01-18 MED ORDER — ZOLPIDEM TARTRATE 5 MG PO TABS: 5.0000 mg | ORAL_TABLET | Freq: Every evening | ORAL | Status: DC | PRN

## 2013-01-18 MED ORDER — BUPIVACAINE IN DEXTROSE 0.75-8.25 % IT SOLN
INTRATHECAL | Status: DC | PRN
  Administered 2013-01-18: 1.4 mL via INTRATHECAL

## 2013-01-18 MED ORDER — SCOPOLAMINE 1 MG/3DAYS TD PT72
MEDICATED_PATCH | TRANSDERMAL | Status: AC
  Filled 2013-01-18: qty 1

## 2013-01-18 MED ORDER — MIDAZOLAM HCL 2 MG/2ML IJ SOLN: 0.5000 mg | Freq: Once | INTRAMUSCULAR | Status: DC | PRN

## 2013-01-18 MED ORDER — WITCH HAZEL-GLYCERIN EX PADS: 1.0000 "application " | MEDICATED_PAD | CUTANEOUS | Status: DC | PRN

## 2013-01-18 MED ORDER — NALOXONE HCL 1 MG/ML IJ SOLN
1.0000 ug/kg/h | INTRAMUSCULAR | Status: DC | PRN
  Filled 2013-01-18: qty 2

## 2013-01-18 MED ORDER — DIPHENHYDRAMINE HCL 25 MG PO CAPS: 25.0000 mg | ORAL_CAPSULE | Freq: Four times a day (QID) | ORAL | Status: DC | PRN

## 2013-01-18 MED ORDER — KETOROLAC TROMETHAMINE 30 MG/ML IJ SOLN
INTRAMUSCULAR | Status: AC
  Administered 2013-01-18: 30 mg via INTRAVENOUS
  Filled 2013-01-18: qty 1

## 2013-01-18 MED ORDER — LANOLIN HYDROUS EX OINT: 1.0000 "application " | TOPICAL_OINTMENT | CUTANEOUS | Status: DC | PRN

## 2013-01-18 SURGICAL SUPPLY — 37 items
BENZOIN TINCTURE PRP APPL 2/3 (GAUZE/BANDAGES/DRESSINGS) ×2 IMPLANT
CLAMP CORD UMBIL (MISCELLANEOUS) IMPLANT
CLOTH BEACON ORANGE TIMEOUT ST (SAFETY) ×2 IMPLANT
DRAPE LG THREE QUARTER DISP (DRAPES) ×2 IMPLANT
DRSG OPSITE 6X11 MED (GAUZE/BANDAGES/DRESSINGS) ×2 IMPLANT
DRSG OPSITE POSTOP 4X10 (GAUZE/BANDAGES/DRESSINGS) ×2 IMPLANT
DURAPREP 26ML APPLICATOR (WOUND CARE) ×2 IMPLANT
ELECT REM PT RETURN 9FT ADLT (ELECTROSURGICAL) ×2
ELECTRODE REM PT RTRN 9FT ADLT (ELECTROSURGICAL) ×1 IMPLANT
EXTRACTOR VACUUM KIWI (MISCELLANEOUS) IMPLANT
GLOVE BIO SURGEON ST LM GN SZ9 (GLOVE) ×2 IMPLANT
GLOVE BIOGEL PI IND STRL 9 (GLOVE) ×1 IMPLANT
GLOVE BIOGEL PI INDICATOR 9 (GLOVE) ×1
GOWN PREVENTION PLUS XLARGE (GOWN DISPOSABLE) ×2 IMPLANT
GOWN STRL REIN 3XL LVL4 (GOWN DISPOSABLE) ×2 IMPLANT
GOWN STRL REIN XL XLG (GOWN DISPOSABLE) ×4 IMPLANT
NEEDLE HYPO 25X5/8 SAFETYGLIDE (NEEDLE) IMPLANT
NS IRRIG 1000ML POUR BTL (IV SOLUTION) ×2 IMPLANT
PACK C SECTION WH (CUSTOM PROCEDURE TRAY) ×2 IMPLANT
PAD OB MATERNITY 4.3X12.25 (PERSONAL CARE ITEMS) ×2 IMPLANT
RETRACTOR WND ALEXIS 25 LRG (MISCELLANEOUS) IMPLANT
RTRCTR C-SECT PINK 25CM LRG (MISCELLANEOUS) IMPLANT
RTRCTR WOUND ALEXIS 25CM LRG (MISCELLANEOUS)
STRIP CLOSURE SKIN 1/2X4 (GAUZE/BANDAGES/DRESSINGS) ×2 IMPLANT
SUT CHROMIC 0 CTX 36 (SUTURE) ×4 IMPLANT
SUT PDS AB 0 CTX 36 PDP370T (SUTURE) ×2 IMPLANT
SUT PLAIN 2 0 (SUTURE) ×1
SUT PLAIN ABS 2-0 CT1 27XMFL (SUTURE) ×1 IMPLANT
SUT VIC AB 0 CT1 27 (SUTURE) ×1
SUT VIC AB 0 CT1 27XBRD ANBCTR (SUTURE) ×1 IMPLANT
SUT VIC AB 2-0 CT1 27 (SUTURE) ×2
SUT VIC AB 2-0 CT1 TAPERPNT 27 (SUTURE) ×2 IMPLANT
SUT VIC AB 4-0 KS 27 (SUTURE) ×2 IMPLANT
SYR BULB IRRIGATION 50ML (SYRINGE) IMPLANT
TOWEL OR 17X24 6PK STRL BLUE (TOWEL DISPOSABLE) ×2 IMPLANT
TRAY FOLEY CATH 14FR (SET/KITS/TRAYS/PACK) ×2 IMPLANT
WATER STERILE IRR 1000ML POUR (IV SOLUTION) IMPLANT

## 2013-01-18 NOTE — Progress Notes (Signed)
Julie Allen is a 22 y.o. [id number] at [redacted]w[redacted]d. IOL for GD A2 (past medical Hx of SVD of a SB at 28weeks, also a C section secondary to pre-eclampsia on her 2nd pregnancy)  Subjective: More uncomfortable with contractions no pain med requested yet.   Objective: BP 121/69  Pulse 107  Temp(Src) 98.5 F (36.9 C) (Oral)  Resp 18  Ht 5' (1.524 m)  Wt 81.647 kg (180 lb)  BMI 35.15 kg/m2  LMP 04/10/2012    FHT:  FHR: 145 bpm, variability: moderate,  accelerations:  Present,  decelerations:  Absent UC:   regular, every 2.5 minutes, but not adequate yet at 20 u of Oxytocin.  SVE:   Dilation: 4.5 Effacement (%): 60 Station: -2 Exam by:: Dr Aviva Signs  Assessment / Plan: Induction of labor due to gestational diabetes,  progressing well on pitocin  Labor: on Pitocin. Pt with no cervical changes since last exam. Discussed with Dr. Thad Ranger and Dr. Debroah Loop and they recommended to increase dose of Pitocin to 24 and continue to f/u as long as baby monitoring is wnl.  Fetal Wellbeing:  Category II Pain Control:  Epidural in plan Anticipated MOD:  NSVD hopefully. Update pt in Spanish and discussed possibility of C section if fetal monitoring or her condition will require it. Pt desires to continue with current plan of vaginal delivery but open to the possibility of c section if indicated.  [name], Izick Gasbarro 01/18/2013, 2:51 PM

## 2013-01-18 NOTE — Progress Notes (Signed)
Called Dr Brayton Caves to inform him that patient's cbg in PACU 61 low but patient asymptomatic.  Verbal order to give 4oz apple juice.  Verified order.

## 2013-01-18 NOTE — Progress Notes (Signed)
Requested resident to bedside to determine presentation.  Change in tracing FHT's..now above umbilicus.

## 2013-01-18 NOTE — Anesthesia Procedure Notes (Signed)
Spinal  Patient location during procedure: OR Start time: 01/18/2013 8:32 PM Staffing Anesthesiologist: Angus Seller., Harrell Gave. Performed by: anesthesiologist  Preanesthetic Checklist Completed: patient identified, site marked, surgical consent, pre-op evaluation, timeout performed, IV checked, risks and benefits discussed and monitors and equipment checked Spinal Block Patient position: sitting Prep: DuraPrep Patient monitoring: heart rate, cardiac monitor, continuous pulse ox and blood pressure Approach: midline Location: L3-4 Injection technique: single-shot Needle Needle type: Sprotte  Needle gauge: 24 G Needle length: 9 cm Assessment Sensory level: T4 Additional Notes Patient identified.  Risk benefits discussed including failed block, incomplete pain control, headache, nerve damage, paralysis, blood pressure changes, nausea, vomiting, reactions to medication both toxic or allergic, and postpartum back pain.  Patient expressed understanding and wished to proceed.  All questions were answered.  Sterile technique used throughout procedure.  CSF was clear.  No parasthesia or other complications.  Please see nursing notes for vital signs.

## 2013-01-18 NOTE — Progress Notes (Signed)
Infant too high to attempt AROM per resident.

## 2013-01-18 NOTE — Progress Notes (Signed)
Patient has developed a stronger labor pattern, now regular q . Pt breathing thru contractions.  Fhr Cat I Contractions adequate Cx: 4/50%/-3 station with NO descent of vertex with contraction. IMP:  Failure to progress, suspect CPD Recommendation for cesarean and rationale discussed with patient thru translator. Type and screen in place. Consent obtained.\ OR notified.  Sal Spratley v [employer]

## 2013-01-18 NOTE — Progress Notes (Signed)
Julie Allen is a 22 y.o. [phone] at [redacted]w[redacted]d here for IOL secondary to GD A2  Subjective: Last night pt had 4h break from pitocin which was restarted after midnight. She is still comfortable with contractions.    Objective: BP 100/66  Pulse 82  Temp(Src) 97.9 F (36.6 C) (Oral)  Resp 16  Ht 5' (1.524 m)  Wt 81.647 kg (180 lb)  BMI 35.15 kg/m2  LMP 04/10/2012     FHT:  FHR: 145 bpm, variability: moderate,  accelerations:  Present,  decelerations:  Absent UC:   regular, every 3 minutes SVE:   Dilation: 4.5 Effacement (%): 60 Station: Ballotable Exam by:: ansah-menah, rnc not re-examined.   Assessment / Plan: Induction of labor due to gestational diabetes,  progressing well on pitocin. Will continue to follow.   Labor: Progressing on Pitocin, will continue to increase then AROM Fetal Wellbeing:  Category II Pain Control:  Planned Epidural  I/D:  n/a Anticipated MOD:  NSVD  Julie Allen, Julie Allen 01/18/2013, 8:39 AM

## 2013-01-18 NOTE — Anesthesia Preprocedure Evaluation (Signed)
Anesthesia Evaluation  Patient identified by MRN, date of birth, ID band Patient awake    Reviewed: Allergy & Precautions, H&P , NPO status , Patient's Chart, lab work & pertinent test results  Airway Mallampati: III      Dental no notable dental hx.    Pulmonary neg pulmonary ROS,  breath sounds clear to auscultation  Pulmonary exam normal       Cardiovascular Exercise Tolerance: Good negative cardio ROS  Rhythm:regular Rate:Normal     Neuro/Psych negative neurological ROS  negative psych ROS   GI/Hepatic negative GI ROS, Neg liver ROS,   Endo/Other  negative endocrine ROSdiabetesMorbid obesity  Renal/GU negative Renal ROS  negative genitourinary   Musculoskeletal   Abdominal Normal abdominal exam  (+)   Peds  Hematology negative hematology ROS (+) anemia ,   Anesthesia Other Findings   Reproductive/Obstetrics (+) Pregnancy                           Anesthesia Physical Anesthesia Plan  ASA: III  Anesthesia Plan: Spinal   Post-op Pain Management:    Induction:   Airway Management Planned:   Additional Equipment:   Intra-op Plan:   Post-operative Plan:   Informed Consent: I have reviewed the patients History and Physical, chart, labs and discussed the procedure including the risks, benefits and alternatives for the proposed anesthesia with the patient or authorized representative who has indicated his/her understanding and acceptance.     Plan Discussed with: Anesthesiologist, CRNA and Surgeon  Anesthesia Plan Comments:         Anesthesia Quick Evaluation

## 2013-01-18 NOTE — Progress Notes (Signed)
Dr. Emelda Fear and Dr. Thad Ranger discuss plan of care with patient. Spanish interpreter present.

## 2013-01-18 NOTE — Progress Notes (Signed)
I stopped by to check on patient's needs.

## 2013-01-18 NOTE — Transfer of Care (Signed)
Immediate Anesthesia Transfer of Care Note  Patient: Julie Allen  Procedure(s) Performed: Procedure(s): CESAREAN SECTION (N/A)  Patient Location: PACU  Anesthesia Type:Regional and Spinal  Level of Consciousness: awake, alert  and oriented  Airway & Oxygen Therapy: Patient Spontanous Breathing  Post-op Assessment: Report given to PACU RN and Post -op Vital signs reviewed and stable  Post vital signs: Reviewed and stable  Complications: No apparent anesthesia complications

## 2013-01-18 NOTE — Progress Notes (Signed)
Julie Allen is a 22 y.o. [id number] at [redacted]w[redacted]d. IOL for GD A2 (past medical Hx of SVD of a SB at 28weeks, also a C section secondary to pre-eclampsia on her 2nd pregnancy)  Subjective: Comfortable with contractions.   Objective: BP 111/63  Pulse 82  Temp(Src) 97.9 F (36.6 C) (Oral)  Resp 16  Ht 5' (1.524 m)  Wt 81.647 kg (180 lb)  BMI 35.15 kg/m2  LMP 04/10/2012   FHT:  FHR: 145 bpm, variability: moderate,  accelerations:  Present,  decelerations:  Absent UC:   regular, every 2.5 minutes SVE:   Dilation: 4.5 Effacement (%): 60 Station: -2 Exam by:: Dr Debroah Loop  Assessment / Plan: Induction of labor due to gestational diabetes,  progressing well on pitocin  Labor: Progressing on Pitocin. Dr. Debroah Loop AROM pt. Clear amniotic fluid. IUPC placed as well. Will continue monitoring.  Fetal Wellbeing:  Category II Pain Control:  planning for epidural when contracctions become more painful.  Anticipated MOD: Hopefully NSVD  Julie Allen, Julie Allen 01/18/2013, 10:08 AM

## 2013-01-18 NOTE — Progress Notes (Signed)
Had spanish interpreter in PACU for translation - Maday Witmore, Spanish Interpreter

## 2013-01-18 NOTE — Anesthesia Postprocedure Evaluation (Signed)
  Anesthesia Post Note  Patient: Julie Allen  Procedure(s) Performed: Procedure(s) (LRB): CESAREAN SECTION (N/A)  Anesthesia type: Spinal  Patient location: PACU  Post pain: Pain level controlled  Post assessment: Post-op Vital signs reviewed  Last Vitals:  Filed Vitals:   01/18/13 2245  BP: 107/64  Pulse: 100  Temp: 37 C  Resp: 20    Post vital signs: Reviewed  Level of consciousness: awake  Complications: No apparent anesthesia complications

## 2013-01-18 NOTE — Progress Notes (Signed)
Julie Allen is a 22 y.o. [phone] at [redacted]w[redacted]d   Subjective: Appears to be sleeping; this evening had a rest from Pitocin x 4 hours and had a light meal and shower  Objective: BP 109/73  Pulse 104  Temp(Src) 99.1 F (37.3 C) (Oral)  Resp 20  Ht 5' (1.524 m)  Wt 81.647 kg (180 lb)  BMI 35.15 kg/m2  LMP 04/10/2012      FHT:  FHR: 145-150 bpm, variability: moderate,  accelerations:  Present,  decelerations:  Absent- occ mi variables UC:   irregular, every 10+ minutes with Pitocin SVE:   Dilation: 4.5 Effacement (%): 60 Station: Ballotable Exam by:: ansah-menah, rnc- not reexamined  Labs: Lab Results  Component Value Date   WBC 6.0 01/16/2013   HGB 10.1* 01/16/2013   HCT 32.9* 01/16/2013   MCV 73.8* 01/16/2013   PLT 318 01/16/2013    Assessment / Plan: Induction process  Continue to increase Pitocin to achieve adequate ctx   SHAW, KIMBERLY 01/18/2013, 3:24 AM

## 2013-01-18 NOTE — Op Note (Signed)
Julie Allen PROCEDURE DATE: 01/16/2013 - 01/18/2013  PREOPERATIVE DIAGNOSIS: Intrauterine pregnancy at  [redacted]w[redacted]d weeks gestation; prior cesarean section, failed TOLAC, arrest of descent and dilation  POSTOPERATIVE DIAGNOSIS: The same  PROCEDURE: Repeat Low Transverse Cesarean Section with scar revision  SURGEON:  Christin Bach, MD  ASSISTANT:  Napoleon Form, MD   INDICATIONS: Julie Allen is a 22 y.o. [id number] at 109w2d here for cesarean section secondary to the indications listed under preoperative diagnosis; please see preoperative note for further details.  The risks of cesarean section were discussed with the patient including but were not limited to: bleeding which may require transfusion or reoperation; infection which may require antibiotics; injury to bowel, bladder, ureters or other surrounding organs; injury to the fetus; need for additional procedures including hysterectomy in the event of a life-threatening hemorrhage; placental abnormalities wth subsequent pregnancies, incisional problems, thromboembolic phenomenon and other postoperative/anesthesia complications.   The patient concurred with the proposed plan, giving informed written consent for the procedure.    FINDINGS:  Viable female infant in cephalic presentation.  Apgars 8 and 9.  Weight 7 lb, 8.5 oz. Clear amniotic fluid.  Intact placenta, three vessel cord.  Normal uterus, fallopian tubes and ovaries bilaterally.  ANESTHESIA: Spinal INTRAVENOUS FLUIDS: 2700 ml ESTIMATED BLOOD LOSS: 600 ml URINE OUTPUT:  100 ml SPECIMENS: Placenta sent L&D COMPLICATIONS: None immediate  PROCEDURE IN DETAIL:  The patient preoperatively received intravenous antibiotics and had sequential compression devices applied to her lower extremities.  She was then taken to the operating room where spinal anesthesia was administered and was found to be adequate. She was then placed in a dorsal supine position with a leftward tilt, and prepped  and draped in a sterile manner.  A foley catheter was placed into her bladder and attached to constant gravity.  After an adequate timeout was performed, a vertical skin incision was made with the scalpel through the previous cesarean scar and carried through to the underlying layer of fascia. The previous scar was excised.  The fascia was incised in the midline, and this incision was extended superiorly and inferiorly using Mayo scissors.  The underlying rectus muscles were then separated at midline and incised with Mayo scissors. The peritoneum was then entered at midline with blunt and sharp dissection. A bladder blade was placed. Attention was turned to the lower uterine segment where a low transverse hysterotomy incision was made with a scalpel and extended bilaterally with bandage scissors.  The infant was successfully delivered, the cord was clamped and cut and the infant was handed over to awaiting neonatology team. Uterine massage was then administered, and the placenta delivered intact with a three-vessel cord. The uterus was then exteriorized and cleared of clot and debris.  The hysterotomy was closed with 1-0 chromic in a running locked fashion, and an imbricating layer was also placed with the same suture. The uterus was returned to the pelvis. The pelvis was cleared of all clot and debris. Hemostasis was confirmed on all surfaces.  The peritoneum and the muscles were reapproximated using 2-0 Vicryl in running stitch. The fascia was then closed using 0 PDS in a running fashion.  The subcutaneous layer was irrigated, then reapproximated with 2-0 plain suture in a running fashion, and the skin was closed with a 4-0 Vicryl subcuticular stitch. The patient tolerated the procedure well. Sponge, lap, instrument and needle counts were correct x 2.  She was taken to the recovery room in stable condition.   Napoleon Form, MD

## 2013-01-18 NOTE — Progress Notes (Signed)
Julie Allen is a 22 y.o. [id number] at [redacted]w[redacted]d by ultrasound admitted for induction of labor due to Gestational diabetes.  Subjective: Patient has been in hospital since Monday, with foley bulb ripening of cervix resulting in expulsion of balloon over 30 hours ago. Pt had AROM this morning, 10 hours ago, and still has made no progress so far. IUPC is in place, pitocin is at 28 mU/min, and pt contractions are at suboptimal levels. Lengthy discussion with patient thru translator, emphasizing my concern that the lack of progress is not likely to change. Cesarean at this time offered as my preferred therapy, and patient is not willling at this time to proceed to cesarean.  We have agreed to reexamine in 2 hours, and progress(by same examiner) must be seen, to continue.  \  Objective: BP 119/64  Pulse 100  Temp(Src) 98.6 F (37 C) (Oral)  Resp 20  Ht 5' (1.524 m)  Wt 180 lb (81.647 kg)  BMI 35.15 kg/m2  LMP 04/10/2012      FHT:  FHR: 140 bpm, variability: minimal ,  accelerations:  Present,  decelerations:  Absent UC:   irregular, every 4-8 minutes SVE:   Dilation: 4 Effacement (%): 60 Station: -2 Exam by:: Emelda Fear, MD-3 station, vertex poorly applied to cervix.  Labs: Lab Results  Component Value Date   WBC 6.0 01/16/2013   HGB 10.1* 01/16/2013   HCT 32.9* 01/16/2013   MCV 73.8* 01/16/2013   PLT 318 01/16/2013    Assessment / Plan: IOL on 28 mu pitocin, not progressing. Inadequate labor by IUPC Presenting part remains out of pelvis.  Labor: will continue at current oxytocin levels, and change iv bag solution Preeclampsia:   Fetal Wellbeing:  Category I Pain Control:  Labor support without medications I/D:  n/a Anticipated MOD:  probable cesarean. will need dramatic improvement in progress.  Zerrick Hanssen V 01/18/2013, 6:16 PM

## 2013-01-19 ENCOUNTER — Encounter (HOSPITAL_COMMUNITY): Payer: Self-pay

## 2013-01-19 LAB — CBC
HCT: 23.1 % — ABNORMAL LOW (ref 36.0–46.0)
HCT: 21 % — ABNORMAL LOW (ref 36.0–46.0)
Hemoglobin: 7.1 g/dL — ABNORMAL LOW (ref 12.0–15.0)
Hemoglobin: 6.4 g/dL — CL (ref 12.0–15.0)
MCH: 22.4 pg — ABNORMAL LOW (ref 26.0–34.0)
MCV: 73.3 fL — ABNORMAL LOW (ref 78.0–100.0)
MCV: 73.4 fL — ABNORMAL LOW (ref 78.0–100.0)
RBC: 2.86 MIL/uL — ABNORMAL LOW (ref 3.87–5.11)
WBC: 10.8 10*3/uL — ABNORMAL HIGH (ref 4.0–10.5)

## 2013-01-19 NOTE — Lactation Note (Signed)
This note was copied from the chart of Julie Sharnee Abarca-Tapia. Lactation Consultation Note     Initial consult with this mom and baby. Baby was crying in crib, so mom agreed to have me help latch her baby. Mom tried cradle hold. I showed her how to use cross cradle, and skin to skin. The baby latched well with strong suckles. The baby was very sensitive to mom's hand behind her head. Mom kept her hand behind baby's back, and baby stayed latched well. Basic teaching done for the Baby and Me book from the breast feeding pages. Lactation services reviewed with mom and dad. Information given to mom is in [place]. I spoke to mom and and dad in spanish, but an Interpreter is needed to be sure they understood. The interpreter was called, but did not respond.  An interpreter will be called again, to complete teaching.  Patient Name: Julie Allen [id number] Date: 01/19/2013 Reason for consult: Initial assessment   Maternal Data Formula Feeding for Exclusion: No Infant to breast within first hour of birth: No Breastfeeding delayed due to:: Maternal status Has patient been taught Hand Expression?: Yes Does the patient have breastfeeding experience prior to this delivery?: Yes  Feeding Feeding Type: Breast Milk Feeding method: Breast Length of feed: 10 min  LATCH Score/Interventions Latch: Grasps breast easily, tongue down, lips flanged, rhythmical sucking. Intervention(s): Adjust position;Assist with latch;Breast compression  Audible Swallowing: A few with stimulation Intervention(s): Skin to skin;Hand expression  Type of Nipple: Everted at rest and after stimulation  Comfort (Breast/Nipple): Soft / non-tender     Hold (Positioning): Assistance needed to correctly position infant at breast and maintain latch. Intervention(s): Breastfeeding basics reviewed;Support Pillows;Position options;Skin to skin  LATCH Score: 8  Lactation Tools Discussed/Used     Consult Status Consult  Status: Follow-up Date: 01/20/13 Follow-up type: In-patient    Alfred Levins 01/19/2013, 1:54 PM

## 2013-01-19 NOTE — Progress Notes (Signed)
UR completed 

## 2013-01-19 NOTE — Progress Notes (Signed)
Called by RN to evaluate left labia.  L labia edematous to size of a peach, probable hematoma, although could just be edema.  No erythema or bruising.  Pt only got to 4/50/-3 before she had her c/s.  Pt states that she noticed her labia yesterday after her c/s.  She states that it has not gotten any bigger or smaller.  Discussed with Dr. Debroah Loop, and he agreed that since the area is stable, no need for evacuation.  Pt instructed to keep ice/pressure on it.

## 2013-01-19 NOTE — Progress Notes (Signed)
Subjective: Postpartum Day 1: Cesarean Delivery Patient reports tolerating PO.    Objective: Vital signs in last 24 hours: Temp:  [98.1 F (36.7 C)-99.6 F (37.6 C)] 98.5 F (36.9 C) (06/26 0506) Pulse Rate:  [78-125] 106 (06/26 0506) Resp:  [12-20] 18 (06/26 0506) BP: (88-121)/(40-75) 93/52 mmHg (06/26 0506) SpO2:  [94 %-99 %] 97 % (06/26 0506)  Physical Exam:  General: alert, cooperative and no distress Lochia: appropriate Uterine Fundus: firm Incision: healing well, no significant drainage DVT Evaluation: No evidence of DVT seen on physical exam. Negative Homan's sign.  Recent Labs  01/16/13 0845 01/19/13 0500  HGB 10.1* 7.1*  HCT 32.9* 23.1*   Assessment/Plan: Status post Cesarean section.  Anemia at 7.1g/dl. mildly tachycardic and asymptomatic( no dyspnea, dizziness, lightheadedness, chest pain, focal weakness) Doing well postoperatively. Will repeat Hb this afternoon if around 6 and /or symptomatic, transfuse 2 units.  Continue current care.  [name], DAYARMYS 01/19/2013, 8:02 AM

## 2013-01-19 NOTE — Progress Notes (Signed)
Teaching initiated with help from spanish interpreter

## 2013-01-19 NOTE — Progress Notes (Signed)
Up to BR. Pericare done. Did very well

## 2013-01-19 NOTE — Anesthesia Postprocedure Evaluation (Signed)
  Anesthesia Post-op Note  Patient: Julie Allen  Procedure(s) Performed: Procedure(s): CESAREAN SECTION (N/A)  Patient Location: Mother/Baby  Anesthesia Type:Spinal  Level of Consciousness: awake, alert , oriented and patient cooperative  Airway and Oxygen Therapy: Patient Spontanous Breathing  Post-op Pain: mild  Post-op Assessment: Patient's Cardiovascular Status Stable, Respiratory Function Stable, Patent Airway, No signs of Nausea or vomiting, Adequate PO intake and Pain level controlled  Post-op Vital Signs: Reviewed and stable  Complications: No apparent anesthesia complications

## 2013-01-19 NOTE — Progress Notes (Signed)
Helped pt unto a steady to go to BR. Pt states she feels very dizzy, helped back into bed

## 2013-01-19 NOTE — Progress Notes (Signed)
Attempting to get pt OOB to BR. Pt's states she feels a little  dizzy. Will let pt sit on side of bed for a while before getting out of bed

## 2013-01-19 NOTE — Progress Notes (Signed)
Dr. Thad Ranger informed about pt's CBC. No orders given

## 2013-01-19 NOTE — Progress Notes (Signed)
Spanish interpreter at bedside.

## 2013-01-20 LAB — CBC
HCT: 21.3 % — ABNORMAL LOW (ref 36.0–46.0)
MCH: 22.7 pg — ABNORMAL LOW (ref 26.0–34.0)
MCHC: 30.5 g/dL (ref 30.0–36.0)
MCV: 74.5 fL — ABNORMAL LOW (ref 78.0–100.0)
RDW: 18.2 % — ABNORMAL HIGH (ref 11.5–15.5)
WBC: 7.8 10*3/uL (ref 4.0–10.5)

## 2013-01-20 LAB — TYPE AND SCREEN: Unit division: 0

## 2013-01-20 NOTE — Progress Notes (Signed)
I saw and examined patient and agree with above resident note. I reviewed history, delivery summary, labs and vitals. Ashrith Sagan, MD  

## 2013-01-20 NOTE — Progress Notes (Signed)
I stopped by to check on patient.

## 2013-01-20 NOTE — Progress Notes (Signed)
Dr. Adriana Simas notified about hemaglobin level from 1500 draw of 6.5.

## 2013-01-20 NOTE — Progress Notes (Signed)
Subjective: Postpartum Day 2 : Cesarean Delivery Pt reports  tolerating PO, + flatus and no problems voiding.  Pt reports mild pain on left inguinal area, pubic region and left labia. Yesterday around 2:00 pm she started noticing edema on her left labia and was evaluated by Dr. Debroah Loop. She reports the area today is softer and less tender but also more diffused (less localized) Pt denies dizziness, lightheadedness, palpitations, chest pain or focal weakness.   Objective: Vital signs in last 24 hours: Temp:  [97.8 F (36.6 C)-98.8 F (37.1 C)] 98.3 F (36.8 C) (06/27 0600) Pulse Rate:  [100-110] 105 (06/27 0600) Resp:  [18-20] 20 (06/27 0600) BP: (99-113)/(60-67) 104/61 mmHg (06/27 0600) SpO2:  [97 %-98 %] 97 % (06/26 2100)  Physical Exam:  General: alert, cooperative and no distress Lochia: appropriate Uterine Fundus: firm Incision: healing well, very mild sanguinolent drainage from the inferior portion of incision that only has moisten dressing.  There is edema that extends from left inguinal, anterior pubic area to left labia. Soft non-erythematous, mildly tender to palpation.  DVT Evaluation: No evidence of DVT seen on physical exam.   Recent Labs  01/19/13 0500 01/19/13 1614  HGB 7.1* 6.4*  HCT 23.1* 21.0*   Assessment/Plan: Status post Cesarean section.   Anemia: Hb dropped from 7.1 to 6.4. Only tachycardic without other symptoms.  Hematoma: diagnosed yesterday, stable. Discussed with Mayford Knife, CNM and Dr. Epifanio Lesches  and will repeat CBC this afternoon. If Hb continues to drop will consider blood transfusion and discuss vessel embolization per IR. All this has been discussed with pt in Spanish.  Continue current care.  Julie Allen, Julie Allen 01/20/2013, 9:51 AM  Seen also by me and by Dr Macon Large. Left labial hematoma does seem to have its origin near bottom edge of incision. But patient reports it is not any bigger than yesterday and is softer.   She is asymptomatic with anemia.  Will check Hgb this afternoon.  Wynelle Bourgeois CNM

## 2013-01-20 NOTE — Progress Notes (Signed)
CRITICAL VALUE ALERT  Critical value received:  hemaglobin 6.5  Date of notification: 6/27  Time of notification:  1530  Critical value read back: yes  Nurse who received alert: Corey Harold RN  MD notified (1st page): Adriana Simas MD  Time of first page:  1730  MD notified (2nd page):  Time of second page:  Responding MD:  Dr. Adriana Simas  Time MD responded: [phone]

## 2013-01-21 MED ORDER — FERROUS SULFATE 325 (65 FE) MG PO TBEC: 325.0000 mg | DELAYED_RELEASE_TABLET | Freq: Three times a day (TID) | ORAL | Status: DC

## 2013-01-21 MED ORDER — IBUPROFEN 600 MG PO TABS: 600.0000 mg | ORAL_TABLET | Freq: Four times a day (QID) | ORAL | Status: DC

## 2013-01-21 MED ORDER — IBUPROFEN 600 MG PO TABS: 600.0000 mg | ORAL_TABLET | Freq: Four times a day (QID) | ORAL | Status: DC | PRN

## 2013-01-21 MED ORDER — OXYCODONE-ACETAMINOPHEN 5-325 MG PO TABS: 1.0000 | ORAL_TABLET | ORAL | Status: DC | PRN

## 2013-01-21 NOTE — Discharge Summary (Signed)
Obstetric Discharge Summary Reason for Admission: induction of labor Prenatal Procedures: ultrasound, ECV Intrapartum Procedures: Repeat Low Transverse Cesarean Section with scar revision. Postpartum Procedures: none Complications-Operative and Postpartum: left labial hematoma, Anemia Hemoglobin  Date Value Range Status  01/20/2013 6.5* 12.0 - 15.0 g/dL Final     REPEATED TO VERIFY     CRITICAL RESULT CALLED TO, READ BACK BY AND VERIFIED WITH:     BEARLE AT 1522 ON 6.27.14 BY JPEEBLES     HCT  Date Value Range Status  01/20/2013 21.3* 36.0 - 46.0 % Final    Physical Exam:  General: alert, cooperative and no distress Lochia: appropriate Uterine Fundus: firm Incision: healing well DVT Evaluation: No evidence of DVT seen on physical exam. Negative Homan's sign.  Discharge Diagnoses: Term Pregnancy-delivered by repeat LTCS. Asymptomatic Anemia. Left Labial hematoma almost resolved by the time of discharge.  Discharge Information: Date: 01/21/2013 Activity: unrestricted Diet: routine Medications: Ibuprofen and Percocet, Iron Condition: improved Instructions: refer to practice specific booklet Discharge to: home Follow-up Information   Follow up with Lillia Abed, MD. (haz una cita para seguimiento en 4 semanas.)    Contact information:   [address] [place] [address] [phone]       [facility] with [facility] (haz una cita para la insercion del Nexplanon en 4-6 semanas )    Contact information:   [address] [place] [address]       Newborn Data: Live born female  Birth Weight: 7 lb 8.5 oz (3415 g) APGAR: 8, 9  Home with mother.  Lillia Abed 01/21/2013, 11:16 AM     I spoke with and examined patient and agree with resident's note and plan of care.   Hospital Course:  Pt was admitted at [redacted]w[redacted]d for IOL/TOLAC d/t A2DM on glyburide. A cervical foley bulb was placed w/o difficulty and subsequently fell out, cervix was  4/50/-3, pitocin was started, pt was eventually arom'd. No cervical change x close to 36hrs s/p foley bulb falling out despite arom and adequate uc's, so a repeat LTCS was performed w/ revision of previous vertical skin incision. EBL . Admission hgb 10.1, repeat pp 7.1-->6.4-->6.5, asymptomatic, no transfusion needed, po iron supplementation given.  Pt developed a Lt labial hematoma that has remained stable and is almost resolved. CBGs stable pp off of glyburide.  F/U 1wk in clinic for wound check, 4wks w/ [facility] for pp visit, and then nexplanon insertion Breastfeeding well  Cheral Marker, CNM, [facility] 01/21/2013 11:49 AM

## 2013-01-22 NOTE — Discharge Summary (Signed)
Attestation of Attending Supervision of Advanced Practitioner (CNM/NP): Evaluation and management procedures were performed by the Advanced Practitioner under my supervision and collaboration.  I have reviewed the Advanced Practitioner's note and chart, and I agree with the management and plan.  Lorilei Horan 01/22/2013 6:37 AM

## 2013-01-22 NOTE — Op Note (Signed)
Attestation of Attending Supervision of Advanced Practitioner: Evaluation and management procedures were performed by the PA/NP/CNM/OB Fellow under my supervision/collaboration. Chart reviewed and agree with management and plan.  Tilda Burrow 01/22/2013 5:47 PM

## 2013-02-13 ENCOUNTER — Ambulatory Visit: Payer: Self-pay | Admitting: Obstetrics and Gynecology

## 2013-02-16 ENCOUNTER — Ambulatory Visit: Payer: Self-pay | Admitting: Family Medicine

## 2013-03-02 ENCOUNTER — Ambulatory Visit: Payer: Self-pay | Admitting: Family Medicine

## 2013-03-20 NOTE — Progress Notes (Unsigned)
This encounter was created in error - please disregard.  This encounter was created in error - please disregard.

## 2013-03-22 ENCOUNTER — Ambulatory Visit: Payer: Self-pay | Admitting: Obstetrics & Gynecology

## 2014-05-28 ENCOUNTER — Encounter (HOSPITAL_COMMUNITY): Payer: Self-pay

## 2015-08-29 ENCOUNTER — Inpatient Hospital Stay (HOSPITAL_COMMUNITY): Payer: Self-pay

## 2015-08-29 ENCOUNTER — Inpatient Hospital Stay (HOSPITAL_COMMUNITY)
Admission: AD | Admit: 2015-08-29 | Discharge: 2015-08-29 | Disposition: A | Discharge status: Home / Self Care | Payer: Self-pay | Source: Ambulatory Visit | Attending: Obstetrics & Gynecology | Admitting: Obstetrics & Gynecology

## 2015-08-29 ENCOUNTER — Encounter (HOSPITAL_COMMUNITY): Payer: Self-pay | Admitting: *Deleted

## 2015-08-29 DIAGNOSIS — Z3A10 10 weeks gestation of pregnancy: Secondary | ICD-10-CM | POA: Insufficient documentation

## 2015-08-29 DIAGNOSIS — O209 Hemorrhage in early pregnancy, unspecified: Secondary | ICD-10-CM | POA: Insufficient documentation

## 2015-08-29 LAB — URINALYSIS, ROUTINE W REFLEX MICROSCOPIC
BILIRUBIN URINE: NEGATIVE
GLUCOSE, UA: NEGATIVE mg/dL
KETONES UR: NEGATIVE mg/dL
LEUKOCYTES UA: NEGATIVE
Nitrite: NEGATIVE
PH: 6 (ref 5.0–8.0)
Protein, ur: NEGATIVE mg/dL
Specific Gravity, Urine: 1.02 (ref 1.005–1.030)

## 2015-08-29 LAB — CBC
HCT: 35.2 % — ABNORMAL LOW (ref 36.0–46.0)
Hemoglobin: 11.4 g/dL — ABNORMAL LOW (ref 12.0–15.0)
MCH: 25.4 pg — ABNORMAL LOW (ref 26.0–34.0)
MCHC: 32.4 g/dL (ref 30.0–36.0)
MCV: 78.6 fL (ref 78.0–100.0)
Platelets: 280 10*3/uL (ref 150–400)
RBC: 4.48 MIL/uL (ref 3.87–5.11)
RDW: 16.4 % — ABNORMAL HIGH (ref 11.5–15.5)
WBC: 7.1 10*3/uL (ref 4.0–10.5)

## 2015-08-29 LAB — POCT PREGNANCY, URINE: Preg Test, Ur: POSITIVE — AB

## 2015-08-29 LAB — HCG, QUANTITATIVE, PREGNANCY: hCG, Beta Chain, Quant, S: 4557 m[IU]/mL — ABNORMAL HIGH (ref <5)

## 2015-08-29 LAB — WET PREP, GENITAL
Clue Cells Wet Prep HPF POC: NONE SEEN
Sperm: NONE SEEN
Trich, Wet Prep: NONE SEEN
Yeast Wet Prep HPF POC: NONE SEEN

## 2015-08-29 LAB — URINE MICROSCOPIC-ADD ON

## 2015-08-29 NOTE — MAU Provider Note (Signed)
Chief Complaint: Vaginal bleeding in pregnancy   First Provider Initiated Contact with Patient 08/29/15 1614        SUBJECTIVE  HPI HPI: Julie Allen is a 25 y.o. [id number] at [redacted]w[redacted]d by LMP who presents to maternity admissions reporting light bleeding for one week.  No cramping. Had + UPT at [facility], but has had no exams or tests yet. She denies vaginal itching/burning, urinary symptoms, h/a, dizziness, n/v, or fever/chills.     Past Medical History  Diagnosis Date  . Pre-eclampsia/eclampsia with prior hypertension in pregnancy   . Gestational diabetes     glyburide   Past Surgical History  Procedure Laterality Date  . Cesarean section      vertical skin incision  . Cesarean section N/A 01/18/2013    Procedure: CESAREAN SECTION;  Surgeon: Tilda Burrow, MD;  Location: [facility];  Service: Obstetrics;  Laterality: N/A;   Social History   Social History  . Marital Status: Married    Spouse Name: N/A  . Number of Children: N/A  . Years of Education: N/A   Occupational History  . Not on file.   Social History Main Topics  . Smoking status: Never Smoker   . Smokeless tobacco: Never Used  . Alcohol Use: No  . Drug Use: No  . Sexual Activity: Yes   Other Topics Concern  . Not on file   Social History Narrative   No current facility-administered medications on file prior to encounter.   Current Outpatient Prescriptions on File Prior to Encounter  Medication Sig Dispense Refill  . ferrous sulfate 325 (65 FE) MG EC tablet Take 1 tablet (325 mg total) by mouth 3 (three) times daily with meals. 60 tablet 3  . ibuprofen (ADVIL,MOTRIN) 600 MG tablet Take 1 tablet (600 mg total) by mouth every 6 (six) hours as needed for pain. 30 tablet 0  . oxyCODONE-acetaminophen (PERCOCET/ROXICET) 5-325 MG per tablet Take 1 tablet by mouth every 4 (four) hours as needed. 30 tablet 0  . Prenatal Vit-Fe Fumarate-FA (PRENATAL MULTIVITAMIN) TABS Take 1 tablet by mouth daily at 12 noon.      No Known Allergies  ROS:  Review of Systems Review of Systems  Constitutional: Negative for fever and chills.  Gastrointestinal: Negative for nausea, vomiting, abdominal pain, diarrhea and constipation.  Genitourinary: Negative for dysuria. Positive for bleeding Musculoskeletal: Negative for back pain.  Neurological: Negative for dizziness and weakness.    I have reviewed patient's Past Medical Hx, Surgical Hx, Family Hx, Social Hx, medications and allergies.   Physical Exam  Patient Vitals for the past 24 hrs:  BP Temp Pulse Resp  08/29/15 1558 102/61 mmHg 98.2 F (36.8 C) 80 18   Physical Exam  Constitutional: Well-developed, well-nourished female in no acute distress.  Cardiovascular: normal rate Respiratory: normal effort GI: Abd soft, non-tender. Pos BS x 4 MS: Extremities nontender, no edema, normal ROM Neurologic: Alert and oriented x 4.  GU: Neg CVAT.  PELVIC EXAM: Cervix pink, visually closed, without lesion, small amount of red discharge, vaginal walls and external genitalia normal Bimanual exam: Cervix 0/long/high, firm, anterior, neg CMT, uterus nontender, measures 5-6 wk size, adnexa without tenderness, enlargement, or mass   LAB RESULTS Results for orders placed or performed during the hospital encounter of 08/29/15 (from the past 24 hour(s))  Urinalysis, Routine w reflex microscopic (not at [facility])     Status: Abnormal   Collection Time: 08/29/15  4:00 PM  Result Value Ref Range   Color,  Urine YELLOW YELLOW   APPearance CLEAR CLEAR   Specific Gravity, Urine 1.020 1.005 - 1.030   pH 6.0 5.0 - 8.0   Glucose, UA NEGATIVE NEGATIVE mg/dL   Hgb urine dipstick LARGE (A) NEGATIVE   Bilirubin Urine NEGATIVE NEGATIVE   Ketones, ur NEGATIVE NEGATIVE mg/dL   Protein, ur NEGATIVE NEGATIVE mg/dL   Nitrite NEGATIVE NEGATIVE   Leukocytes, UA NEGATIVE NEGATIVE  Urine microscopic-add on     Status: Abnormal   Collection Time: 08/29/15  4:00 PM  Result Value Ref Range    Squamous Epithelial / LPF 0-5 (A) NONE SEEN   WBC, UA 0-5 0 - 5 WBC/hpf   RBC / HPF 6-30 0 - 5 RBC/hpf   Bacteria, UA FEW (A) NONE SEEN  Pregnancy, urine POC     Status: Abnormal   Collection Time: 08/29/15  4:07 PM  Result Value Ref Range   Preg Test, Ur POSITIVE (A) NEGATIVE  Wet prep, genital     Status: Abnormal   Collection Time: 08/29/15  4:30 PM  Result Value Ref Range   Yeast Wet Prep HPF POC NONE SEEN NONE SEEN   Trich, Wet Prep NONE SEEN NONE SEEN   Clue Cells Wet Prep HPF POC NONE SEEN NONE SEEN   WBC, Wet Prep HPF POC FEW (A) NONE SEEN   Sperm NONE SEEN    Results for SHALINI, MAIR (MRN [id number]) as of 08/29/2015 19:35  Ref. Range 08/29/2015 16:23  HCG, Beta Chain, Quant, S Latest Ref Range: <5 mIU/mL 4557 (H)      IMAGING US Ob Comp Less 14 Wks  08/29/2015  CLINICAL DATA:  Bleeding in early pregnancy EXAM: OBSTETRIC <14 WK [place] AND TRANSVAGINAL OB US TECHNIQUE: Both transabdominal and transvaginal ultrasound examinations were performed for complete evaluation of the gestation as well as the maternal uterus, adnexal regions, and pelvic cul-de-sac. Transvaginal technique was performed to assess early pregnancy. COMPARISON:  None. FINDINGS: Intrauterine gestational sac: Single Yolk sac:  Possible Embryo:  No Cardiac Activity: Not applicable Heart Rate: Not applicable  bpm MSD: 21  mm   7 w   0  d Subchorionic hemorrhage: Small subchorionic hemorrhage measuring 2.6 x 1.2 x 2.6 cm. Maternal uterus/adnexae: Right ovary: Normal Left ovary: Normal Other :None Free fluid:  None IMPRESSION: 1. Probable early intrauterine gestational sac with probable yolk sac, but no fetal pole, or cardiac activity yet visualized. Recommend follow-up quantitative B-HCG levels and follow-up US in 14 days to confirm and assess viability. This recommendation follows SRU consensus guidelines: Diagnostic Criteria for Nonviable Pregnancy Early in the First Trimester. Malva Limes Med 2013; 409:8119-14. 2. Small  subchronic hemorrhage. Electronically Signed   By: Signa Kell M.D.   On: 08/29/2015 17:48   US Ob Transvaginal  08/29/2015  CLINICAL DATA:  Bleeding in early pregnancy EXAM: OBSTETRIC <14 WK [place] AND TRANSVAGINAL OB US TECHNIQUE: Both transabdominal and transvaginal ultrasound examinations were performed for complete evaluation of the gestation as well as the maternal uterus, adnexal regions, and pelvic cul-de-sac. Transvaginal technique was performed to assess early pregnancy. COMPARISON:  None. FINDINGS: Intrauterine gestational sac: Single Yolk sac:  Possible Embryo:  No Cardiac Activity: Not applicable Heart Rate: Not applicable  bpm MSD: 21  mm   7 w   0  d Subchorionic hemorrhage: Small subchorionic hemorrhage measuring 2.6 x 1.2 x 2.6 cm. Maternal uterus/adnexae: Right ovary: Normal Left ovary: Normal Other :None Free fluid:  None IMPRESSION: 1. Probable early intrauterine gestational sac with  probable yolk sac, but no fetal pole, or cardiac activity yet visualized. Recommend follow-up quantitative B-HCG levels and follow-up US in 14 days to confirm and assess viability. This recommendation follows SRU consensus guidelines: Diagnostic Criteria for Nonviable Pregnancy Early in the First Trimester. Malva Limes Med 2013; 161:0960-45. 2. Small subchronic hemorrhage. Electronically Signed   By: Signa Kell M.D.   On: 08/29/2015 17:48    MAU Management/MDM: Ordered labs and reviewed results.    This bleeding could represent a normal pregnancy with bleeding, spontaneous abortion or even an ectopic which can be life-threatening.   Cultures were done to rule out pelvic infection Blood drawn for Quant HCG, CBC, ABO/Rh  ASSESSMENT 1. Bleeding in early pregnancy     PLAN Discharge home Discussed expected course.  To include repeat Quant HCG on Saturday then repeat US in a week if numbers go up. SAB and ectopic precautions     Medication List    ASK your doctor about these medications         ferrous sulfate 325 (65 FE) MG EC tablet  Take 1 tablet (325 mg total) by mouth 3 (three) times daily with meals.     ibuprofen 600 MG tablet  Commonly known as:  ADVIL,MOTRIN  Take 1 tablet (600 mg total) by mouth every 6 (six) hours as needed for pain.     oxyCODONE-acetaminophen 5-325 MG tablet  Commonly known as:  PERCOCET/ROXICET  Take 1 tablet by mouth every 4 (four) hours as needed.     prenatal multivitamin Tabs tablet  Take 1 tablet by mouth daily at 12 noon.         Wynelle Bourgeois CNM, MSN Certified Nurse-Midwife 08/29/2015  4:52 PM

## 2015-08-29 NOTE — MAU Note (Signed)
Pt presents to MAU with complaints of lower abdominal cramping that started today with vaginal bleeding that started yesterday. [facility]

## 2015-08-29 NOTE — Discharge Instructions (Signed)
Hemorragia vaginal durante el embarazo (primer trimestre)  (Vaginal Bleeding During Pregnancy, First Trimester)  Durante los primeros meses del embarazo es relativamente frecuente que se presente una pequeña hemorragia (manchas). Esta situación generalmente mejora por sí misma. Estas hemorragias o manchas tienen diversas causas al inicio del embarazo. Algunas manchas pueden estar relacionadas al embarazo y otras no. En la mayoría de los casos, la hemorragia es normal y no es un problema. Sin embargo, la hemorragia también puede ser un signo de algo grave. Debe informar a su médico de inmediato si tiene alguna hemorragia vaginal.  Algunas causas posibles de hemorragia vaginal durante el primer trimestre incluyen:  · Infección o inflamación del cuello del útero.  · Crecimientos anormales (pólipos) en el cuello del útero.  · Aborto espontáneo o amenaza de aborto espontáneo.  · Tejido del embarazo se ha desarrollado fuera del útero y en una trompa de falopio (embarazo ectópico).  · Se han desarrollado pequeños quistes en el útero en lugar de tejido de embarazo (embarazo molar).  INSTRUCCIONES PARA EL CUIDADO EN EL HOGAR   Controle su afección para ver si hay cambios. Las siguientes indicaciones ayudarán a aliviar cualquier molestia que pueda sentir:  · Siga las indicaciones del médico para restringir su actividad. Si el médico le indica descanso en la cama, debe quedarse en la cama y levantarse solo para ir al baño. No obstante, el médico puede permitirle que continué con tareas livianas.  · Si es necesario, organícese para que alguien le ayude con las actividades y responsabilidades cotidianas mientras está en cama.  · Lleve un registro de la cantidad y la saturación de las toallas higiénicas que utiliza cada día. Anote este dato.  · No use tampones.No se haga duchas vaginales.  · No tenga relaciones sexuales u orgasmos hasta que el médico la autorice.  · Si elimina tejido por la vagina, guárdelo para mostrárselo al  médico.  · Tome solo medicamentos de venta libre o recetados, según las indicaciones del médico.  · No tome aspirina, ya que puede causar hemorragias.  · Cumpla con todas las visitas de control, según le indique su médico.  SOLICITE ATENCIÓN MÉDICA SI:  · Tiene un sangrado vaginal en cualquier momento del embarazo.  · Tiene calambres o dolores de parto.  · Tiene fiebre que los medicamentos no pueden controlar.  SOLICITE ATENCIÓN MÉDICA DE INMEDIATO SI:   · Siente calambres intensos en la espalda o en el vientre (abdomen).  · Elimina coágulos grandes o tejido por la vagina.  · La hemorragia aumenta.  · Si se siente mareada, débil o se desmaya.  · Tiene escalofríos.  · Tiene una pérdida importante o sale líquido a borbotones por la vagina.  · Se desmaya mientras defeca.  ASEGÚRESE DE QUE:  · Comprende estas instrucciones.  · Controlará su afección.  · Recibirá ayuda de inmediato si no mejora o si empeora.     Esta información no tiene como fin reemplazar el consejo del médico. Asegúrese de hacerle al médico cualquier pregunta que tenga.     Document Released: 04/22/2005 Document Revised: 07/18/2013  Elsevier Interactive Patient Education ©2016 [employer] Inc.

## 2015-08-30 LAB — HIV ANTIBODY (ROUTINE TESTING W REFLEX): HIV SCREEN 4TH GENERATION: NONREACTIVE

## 2015-08-30 LAB — GC/CHLAMYDIA PROBE AMP (~~LOC~~) NOT AT ARMC
CHLAMYDIA, DNA PROBE: NEGATIVE
NEISSERIA GONORRHEA: NEGATIVE

## 2016-07-03 ENCOUNTER — Encounter (HOSPITAL_COMMUNITY): Payer: Self-pay

## 2016-08-24 LAB — SICKLE CELL SCREEN: SICKLE CELL SCREEN: NORMAL

## 2016-08-24 LAB — OB RESULTS CONSOLE RUBELLA ANTIBODY, IGM: Rubella: IMMUNE

## 2016-08-24 LAB — OB RESULTS CONSOLE HGB/HCT, BLOOD
HEMATOCRIT: 43 %
HEMOGLOBIN: 13.3 g/dL

## 2016-08-24 LAB — OB RESULTS CONSOLE GC/CHLAMYDIA
CHLAMYDIA, DNA PROBE: NEGATIVE
GC PROBE AMP, GENITAL: NEGATIVE

## 2016-08-24 LAB — OB RESULTS CONSOLE PLATELET COUNT: Platelets: 315 10*3/uL

## 2016-08-24 LAB — GLUCOSE TOLERANCE, 1 HOUR (50G) W/O FASTING: Glucose 1 Hour: 131

## 2016-08-24 LAB — OB RESULTS CONSOLE RPR: RPR: NONREACTIVE

## 2016-08-24 LAB — OB RESULTS CONSOLE ABO/RH: RH Type: POSITIVE

## 2016-08-24 LAB — OB RESULTS CONSOLE HEPATITIS B SURFACE ANTIGEN: Hepatitis B Surface Ag: NEGATIVE

## 2016-08-24 LAB — OB RESULTS CONSOLE HIV ANTIBODY (ROUTINE TESTING): HIV: NONREACTIVE

## 2016-08-24 LAB — CYTOLOGY - PAP: Pap: NEGATIVE

## 2016-08-24 LAB — OB RESULTS CONSOLE ANTIBODY SCREEN: ANTIBODY SCREEN: NEGATIVE

## 2016-08-24 LAB — CULTURE, OB URINE: URINE CULTURE, OB: NEGATIVE

## 2016-08-24 LAB — OB RESULTS CONSOLE VARICELLA ZOSTER ANTIBODY, IGG: Varicella: NON-IMMUNE/NOT IMMUNE

## 2016-09-04 ENCOUNTER — Encounter: Payer: Self-pay | Admitting: *Deleted

## 2016-09-07 ENCOUNTER — Ambulatory Visit (INDEPENDENT_AMBULATORY_CARE_PROVIDER_SITE_OTHER): Payer: Medicaid Other | Admitting: Obstetrics and Gynecology

## 2016-09-07 ENCOUNTER — Encounter: Payer: Self-pay | Admitting: Obstetrics and Gynecology

## 2016-09-07 VITALS — BP 101/63 | HR 80

## 2016-09-07 DIAGNOSIS — Z1389 Encounter for screening for other disorder: Secondary | ICD-10-CM

## 2016-09-07 DIAGNOSIS — Z98891 History of uterine scar from previous surgery: Secondary | ICD-10-CM

## 2016-09-07 DIAGNOSIS — Z8632 Personal history of gestational diabetes: Secondary | ICD-10-CM | POA: Diagnosis not present

## 2016-09-07 DIAGNOSIS — Z789 Other specified health status: Secondary | ICD-10-CM

## 2016-09-07 DIAGNOSIS — Z6834 Body mass index (BMI) 34.0-34.9, adult: Secondary | ICD-10-CM

## 2016-09-07 DIAGNOSIS — O09291 Supervision of pregnancy with other poor reproductive or obstetric history, first trimester: Secondary | ICD-10-CM | POA: Diagnosis present

## 2016-09-07 DIAGNOSIS — Z331 Pregnant state, incidental: Secondary | ICD-10-CM

## 2016-09-07 DIAGNOSIS — O09299 Supervision of pregnancy with other poor reproductive or obstetric history, unspecified trimester: Secondary | ICD-10-CM

## 2016-09-07 DIAGNOSIS — O99211 Obesity complicating pregnancy, first trimester: Secondary | ICD-10-CM

## 2016-09-07 DIAGNOSIS — O0990 Supervision of high risk pregnancy, unspecified, unspecified trimester: Secondary | ICD-10-CM

## 2016-09-07 DIAGNOSIS — O99210 Obesity complicating pregnancy, unspecified trimester: Secondary | ICD-10-CM

## 2016-09-07 DIAGNOSIS — Z8759 Personal history of other complications of pregnancy, childbirth and the puerperium: Secondary | ICD-10-CM

## 2016-09-07 LAB — POCT URINALYSIS DIP (DEVICE)
Bilirubin Urine: NEGATIVE
GLUCOSE, UA: NEGATIVE mg/dL
Hgb urine dipstick: NEGATIVE
Ketones, ur: NEGATIVE mg/dL
LEUKOCYTES UA: NEGATIVE
Nitrite: NEGATIVE
PROTEIN: NEGATIVE mg/dL
SPECIFIC GRAVITY, URINE: 1.015 (ref 1.005–1.030)
Urobilinogen, UA: 0.2 mg/dL (ref 0.0–1.0)
pH: 7 (ref 5.0–8.0)

## 2016-09-07 LAB — COMPREHENSIVE METABOLIC PANEL
ALBUMIN: 3.8 g/dL (ref 3.6–5.1)
ALK PHOS: 76 U/L (ref 33–115)
ALT: 39 U/L — AB (ref 6–29)
AST: 27 U/L (ref 10–30)
BUN: 6 mg/dL — AB (ref 7–25)
CHLORIDE: 104 mmol/L (ref 98–110)
CO2: 24 mmol/L (ref 20–31)
Calcium: 9.3 mg/dL (ref 8.6–10.2)
Creat: 0.45 mg/dL — ABNORMAL LOW (ref 0.50–1.10)
Glucose, Bld: 73 mg/dL (ref 65–99)
Potassium: 4 mmol/L (ref 3.5–5.3)
Sodium: 135 mmol/L (ref 135–146)
TOTAL PROTEIN: 7 g/dL (ref 6.1–8.1)
Total Bilirubin: 0.3 mg/dL (ref 0.2–1.2)

## 2016-09-07 LAB — TSH: TSH: 2.76 mIU/L

## 2016-09-07 MED ORDER — ASPIRIN EC 81 MG PO TBEC: 81.0000 mg | DELAYED_RELEASE_TABLET | Freq: Every day | ORAL | 3 refills | Status: DC

## 2016-09-07 NOTE — Progress Notes (Signed)
New ob packet given  Video Interpreter # [id number] OB US scheduled for February 21st @ 0800.  Pt notified.

## 2016-09-08 ENCOUNTER — Encounter: Payer: Self-pay | Admitting: Obstetrics and Gynecology

## 2016-09-08 DIAGNOSIS — Z789 Other specified health status: Secondary | ICD-10-CM | POA: Insufficient documentation

## 2016-09-08 DIAGNOSIS — Z6834 Body mass index (BMI) 34.0-34.9, adult: Secondary | ICD-10-CM | POA: Insufficient documentation

## 2016-09-08 DIAGNOSIS — O99210 Obesity complicating pregnancy, unspecified trimester: Secondary | ICD-10-CM | POA: Insufficient documentation

## 2016-09-08 DIAGNOSIS — R7401 Elevation of levels of liver transaminase levels: Secondary | ICD-10-CM | POA: Insufficient documentation

## 2016-09-08 DIAGNOSIS — R740 Nonspecific elevation of levels of transaminase and lactic acid dehydrogenase [LDH]: Secondary | ICD-10-CM

## 2016-09-08 DIAGNOSIS — Z98891 History of uterine scar from previous surgery: Secondary | ICD-10-CM | POA: Insufficient documentation

## 2016-09-08 LAB — PROTEIN / CREATININE RATIO, URINE
Creatinine, Urine: 63 mg/dL (ref 20–320)
Protein Creatinine Ratio: 79 mg/g creat (ref 21–161)
TOTAL PROTEIN, URINE: 5 mg/dL (ref 5–24)

## 2016-09-08 NOTE — Progress Notes (Signed)
New OB Note  09/07/2016  [facility]  Chief Complaint: NOB  Transfer of Care Patient: Yes, [facility]  History of Present Illness: Ms. Julie Allen is a 26 y.o. [id number] @ 13/15 weeks (EDC 8/16, based on Patient's last menstrual period was 06/04/2016 (exact date).), with the above CC. Preg complicated by has Prior pregnancy with fetal demise; History of severe pre-eclampsia; History of gestational diabetes mellitus (GDM); Pregnancy, supervision, high-risk; Obesity in pregnancy; and BMI 34.0-34.9,adult on her problem list.   Her periods were: regular, qmonth She was using no method when she conceived.  She has Negative signs or symptoms of nausea/vomiting of pregnancy. She has Negative signs or symptoms of miscarriage or preterm labor On any different medications around the time she conceived/early pregnancy: No   ROS: A 12-point review of systems was performed and negative, except as stated in the above HPI.  OBGYN History: As per HPI. OB History  Gravida Para Term Preterm AB Living  7 3 1 2 2 2   SAB TAB Ectopic Multiple Live Births  2       2    # Outcome Date GA Lbr Len/2nd Weight Sex Delivery Anes PTL Lv  7 Current           6 Term 01/18/13 2228w2d  7 lb 8.5 oz (3.415 kg) F CS-Classical Spinal  LIV  5 SAB 2012 5574w0d   U    DEC  4 SAB 2010          3 Preterm 05/07/08 9665w0d  6 lb 9.8 oz (3 kg) F CS-Classical   LIV     Birth Comments: c section secondary to pre-eclampsia -- unsure of Uterine Scar location  2 Preterm 2008 5770w0d    Vag-Spont   FD  1 Gravida              Any issues with any prior pregnancies: yes Any prior children are healthy, doing well, without any problems or issues: yes History of pap smears: Yes. Last pap smear 07/2016 and it was NILM   Past Medical History: Past Medical History:  Diagnosis Date  . Gestational diabetes    glyburide  . History of severe pre-eclampsia     Past Surgical History: Past Surgical History:  Procedure Laterality  Date  . CESAREAN SECTION     vertical skin incision  . CESAREAN SECTION N/A 01/18/2013   Procedure: CESAREAN SECTION;  Surgeon: Tilda BurrowJohn V Ferguson, MD;  Location: [facility];  Service: Obstetrics;  Laterality: N/A;    Family History:  Family History  Problem Relation Age of Onset  . Asthma Neg Hx   . Arthritis Neg Hx   . Alcohol abuse Neg Hx   . Birth defects Neg Hx   . COPD Neg Hx   . Cancer Neg Hx   . Depression Neg Hx   . Diabetes Neg Hx   . Drug abuse Neg Hx   . Early death Neg Hx   . Hearing loss Neg Hx   . Heart disease Neg Hx   . Hyperlipidemia Neg Hx   . Hypertension Neg Hx   . Kidney disease Neg Hx   . Learning disabilities Neg Hx   . Mental illness Neg Hx   . Mental retardation Neg Hx   . Vision loss Neg Hx   . Stroke Neg Hx   . Miscarriages / Stillbirths Neg Hx    She denies any history of mental retardation, birth defects or genetic disorders in her  or the FOB's history  Social History:  Social History   Social History  . Marital status: Married    Spouse name: N/A  . Number of children: N/A  . Years of education: N/A   Occupational History  . Not on file.   Social History Main Topics  . Smoking status: Never Smoker  . Smokeless tobacco: Never Used  . Alcohol use No  . Drug use: No  . Sexual activity: Yes   Other Topics Concern  . Not on file   Social History Narrative  . No narrative on file    Allergy: No Known Allergies  Health Maintenance:  Mammogram Up to Date: not applicable  Current Outpatient Medications: PNV  Physical Exam:   BP 101/63   Pulse 80   LMP 06/04/2016 (Exact Date)  There is no height or weight on file to calculate BMI. Fundal height: not applicable FHTs: 160s  General appearance: Well nourished, well developed female in no acute distress.   Laboratory: 07/2016 [facility] labs Negative: UCx, CBC, UDS, GC-CT, early 1hr (131), hemoglobinopathy panel O pos/RI/varicella NI/rpr neg/hepB neg/hiv neg/flu shot needed/pap neg  2018/  Imaging:  none  Assessment: pt doing well  Plan: 1. Supervision of high risk pregnancy, antepartum Routine care. Patient declines genetic screening. Told to let us know. Patient states she hasn't had an u/s yet his pregnancy. Tentative EDC of 8/16 but patient told will nail down exact EDC after dating u/s - US OB Limited; Future  2. Prior pregnancy with fetal demise Patient states her [facility] for that pregnancy was mid February 2008 and she delivered in early January in [place]. She states there wasn't an autopsy and no known reason why the IUFD occurred. She denies any issues with that pregnancy. Given, that I put the IUFD at around 34wks and I told her I recommend the following: -serial growth scans starting at 24wks -2x/week testing with nst/afi starting at 32wks  3. History of severe pre-eclampsia Patient states this was the reason for her 35wk c-section in [place] Start baby asa today Baseline pre-eclampsia labs today  4. History of gestational diabetes mellitus (GDM) Passed early 1hr. Rpt at usual time  5. History of c-section Needs repeat. D/w pt re: BTL at 28-30wks  Problem list reviewed and updated.  Follow up in 3 weeks.  >50% of 30 min visit spent on counseling and coordination of care.    Interpreter used    [place] Bing, Montez Hageman. MD Attending [facility] [employer] [facility])

## 2016-09-11 ENCOUNTER — Other Ambulatory Visit: Payer: Self-pay | Admitting: *Deleted

## 2016-09-11 DIAGNOSIS — O0990 Supervision of high risk pregnancy, unspecified, unspecified trimester: Secondary | ICD-10-CM

## 2016-09-16 ENCOUNTER — Ambulatory Visit (HOSPITAL_COMMUNITY): Payer: Self-pay

## 2016-09-18 ENCOUNTER — Other Ambulatory Visit: Payer: Self-pay | Admitting: Obstetrics and Gynecology

## 2016-09-18 ENCOUNTER — Ambulatory Visit (HOSPITAL_COMMUNITY)
Admission: RE | Admit: 2016-09-18 | Discharge: 2016-09-18 | Disposition: A | Discharge status: Home / Self Care | Payer: Medicaid Other | Source: Ambulatory Visit | Attending: Obstetrics and Gynecology | Admitting: Obstetrics and Gynecology

## 2016-09-18 ENCOUNTER — Encounter: Payer: Self-pay | Admitting: Obstetrics and Gynecology

## 2016-09-18 ENCOUNTER — Encounter (HOSPITAL_COMMUNITY): Payer: Self-pay

## 2016-09-18 ENCOUNTER — Other Ambulatory Visit (HOSPITAL_COMMUNITY): Payer: Self-pay | Admitting: *Deleted

## 2016-09-18 VITALS — BP 105/65 | HR 72 | Wt 172.8 lb

## 2016-09-18 DIAGNOSIS — Z363 Encounter for antenatal screening for malformations: Secondary | ICD-10-CM | POA: Insufficient documentation

## 2016-09-18 DIAGNOSIS — Z3A15 15 weeks gestation of pregnancy: Secondary | ICD-10-CM | POA: Diagnosis not present

## 2016-09-18 DIAGNOSIS — O09212 Supervision of pregnancy with history of pre-term labor, second trimester: Secondary | ICD-10-CM

## 2016-09-18 DIAGNOSIS — O0990 Supervision of high risk pregnancy, unspecified, unspecified trimester: Secondary | ICD-10-CM

## 2016-09-18 DIAGNOSIS — Z8759 Personal history of other complications of pregnancy, childbirth and the puerperium: Secondary | ICD-10-CM

## 2016-09-18 DIAGNOSIS — IMO0002 Reserved for concepts with insufficient information to code with codable children: Secondary | ICD-10-CM

## 2016-09-18 DIAGNOSIS — O99212 Obesity complicating pregnancy, second trimester: Secondary | ICD-10-CM | POA: Diagnosis not present

## 2016-09-18 DIAGNOSIS — O0992 Supervision of high risk pregnancy, unspecified, second trimester: Secondary | ICD-10-CM | POA: Insufficient documentation

## 2016-09-18 DIAGNOSIS — Z3492 Encounter for supervision of normal pregnancy, unspecified, second trimester: Secondary | ICD-10-CM

## 2016-09-18 DIAGNOSIS — O2622 Pregnancy care for patient with recurrent pregnancy loss, second trimester: Secondary | ICD-10-CM | POA: Insufficient documentation

## 2016-09-18 DIAGNOSIS — O09299 Supervision of pregnancy with other poor reproductive or obstetric history, unspecified trimester: Secondary | ICD-10-CM

## 2016-09-18 DIAGNOSIS — Z8632 Personal history of gestational diabetes: Secondary | ICD-10-CM | POA: Insufficient documentation

## 2016-09-18 DIAGNOSIS — N83202 Unspecified ovarian cyst, left side: Secondary | ICD-10-CM | POA: Insufficient documentation

## 2016-09-18 DIAGNOSIS — O34219 Maternal care for unspecified type scar from previous cesarean delivery: Secondary | ICD-10-CM | POA: Diagnosis not present

## 2016-09-18 DIAGNOSIS — Z0489 Encounter for examination and observation for other specified reasons: Secondary | ICD-10-CM

## 2016-09-18 DIAGNOSIS — O09292 Supervision of pregnancy with other poor reproductive or obstetric history, second trimester: Secondary | ICD-10-CM | POA: Insufficient documentation

## 2016-09-30 ENCOUNTER — Ambulatory Visit (INDEPENDENT_AMBULATORY_CARE_PROVIDER_SITE_OTHER): Payer: Self-pay | Admitting: Obstetrics and Gynecology

## 2016-09-30 VITALS — BP 93/63 | HR 74 | Wt 172.8 lb

## 2016-09-30 DIAGNOSIS — O34219 Maternal care for unspecified type scar from previous cesarean delivery: Secondary | ICD-10-CM

## 2016-09-30 DIAGNOSIS — O0992 Supervision of high risk pregnancy, unspecified, second trimester: Secondary | ICD-10-CM

## 2016-09-30 DIAGNOSIS — N83202 Unspecified ovarian cyst, left side: Secondary | ICD-10-CM

## 2016-09-30 DIAGNOSIS — Z8759 Personal history of other complications of pregnancy, childbirth and the puerperium: Secondary | ICD-10-CM

## 2016-09-30 DIAGNOSIS — Z8632 Personal history of gestational diabetes: Secondary | ICD-10-CM

## 2016-09-30 DIAGNOSIS — Z789 Other specified health status: Secondary | ICD-10-CM

## 2016-09-30 DIAGNOSIS — R7401 Elevation of levels of liver transaminase levels: Secondary | ICD-10-CM

## 2016-09-30 DIAGNOSIS — O09299 Supervision of pregnancy with other poor reproductive or obstetric history, unspecified trimester: Secondary | ICD-10-CM

## 2016-09-30 DIAGNOSIS — O09292 Supervision of pregnancy with other poor reproductive or obstetric history, second trimester: Secondary | ICD-10-CM

## 2016-09-30 DIAGNOSIS — Z23 Encounter for immunization: Secondary | ICD-10-CM

## 2016-09-30 DIAGNOSIS — O99210 Obesity complicating pregnancy, unspecified trimester: Secondary | ICD-10-CM

## 2016-09-30 DIAGNOSIS — Z98891 History of uterine scar from previous surgery: Secondary | ICD-10-CM

## 2016-09-30 DIAGNOSIS — R740 Nonspecific elevation of levels of transaminase and lactic acid dehydrogenase [LDH]: Secondary | ICD-10-CM

## 2016-09-30 NOTE — Progress Notes (Signed)
Prenatal Visit Note Date: 09/30/2016 [facility]  Subjective:  Julie Allen is a 26 y.o. [id number] at 8933w6d being seen today for ongoing prenatal care.  She is currently monitored for the following issues for this high-risk pregnancy and has Prior pregnancy with fetal demise; History of severe pre-eclampsia; History of gestational diabetes mellitus (GDM); Pregnancy, supervision, high-risk; Obesity in pregnancy; BMI 34.0-34.9,adult; History of cesarean delivery x 2; Language barrier; Transaminitis; and Left ovarian cyst on her problem list.  Patient reports no complaints.   Contractions: Not present. Vag. Bleeding: None.  Movement: Absent. Denies leaking of fluid.   The following portions of the patient's history were reviewed and updated as appropriate: allergies, current medications, past family history, past medical history, past social history, past surgical history and problem list. Problem list updated.  Objective:   Vitals:   09/30/16 1537  BP: 93/63  Pulse: 74  Weight: 172 lb 12.8 oz (78.4 kg)    Fetal Status: Fetal Heart Rate (bpm): 146   Movement: Absent     General:  Alert, oriented and cooperative. Patient is in no acute distress.  Skin: Skin is warm and dry. No rash noted.   Cardiovascular: Normal heart rate noted  Respiratory: Normal respiratory effort, no problems with respiration noted  Abdomen: Soft, gravid, appropriate for gestational age. Pain/Pressure: Absent     Pelvic:  Cervical exam deferred        Extremities: Normal range of motion.  Edema: None  Mental Status: Normal mood and affect. Normal behavior. Normal judgment and thought content.   Urinalysis:      Assessment and Plan:  Pregnancy: [id number] at 2533w6d  1. Supervision of high risk pregnancy in second trimester Routine care. F/u completion anatomy scan - Flu Vaccine QUAD 36+ mos IM (Fluarix, Quad PF) - AFP, Quad Screen  2. History of severe pre-eclampsia Continue  baby ASA.   3. Prior pregnancy with fetal demise Serial growth scans. Start 2x/week testing at 32wks - Flu Vaccine QUAD 36+ mos IM (Fluarix, Quad PF)  4. History of cesarean delivery x 2 Desires VBAC. D/w her and PL updated. D/w pt later in pregnancy  5. History of gestational diabetes mellitus (GDM) Negative early testing.  6. Language barrier interpeter used  7. Obesity in pregnancy Continue plan of care  8. Transaminitis Rpt with 28wk labs  9. Left ovarian cyst F/u at completion scan. Appear simple. 4cm @ 15wks.   Preterm labor symptoms and general obstetric precautions including but not limited to vaginal bleeding, contractions, leaking of fluid and fetal movement were reviewed in detail with the patient. Please refer to After Visit Summary for other counseling recommendations.  Return in about 4 weeks (around 10/28/2016) for 3-4wk rob.    Bingharlie Shasha Buchbinder, MD

## 2016-10-14 LAB — AFP, QUAD SCREEN
DIA MOM VALUE: 0.66
DIA Value (EIA): 106.49 pg/mL
DSR (BY AGE) 1 IN: 930
DSR (Second Trimester) 1 IN: 10000
Gestational Age: 16.6 WEEKS
MATERNAL AGE AT EDD: 26.9 a
MSAFP Mom: 0.89
MSAFP: 29 ng/mL
MSHCG MOM: 0.44
MSHCG: 13735 m[IU]/mL
OSB RISK: 10000
T18 (By Age): 1:3625 {titer}
Test Results:: NEGATIVE
UE3 VALUE: 0.69 ng/mL
Weight: 172 [lb_av]
uE3 Mom: 0.75

## 2016-10-16 ENCOUNTER — Encounter (HOSPITAL_COMMUNITY): Payer: Self-pay

## 2016-10-16 ENCOUNTER — Ambulatory Visit (HOSPITAL_COMMUNITY)
Admission: RE | Admit: 2016-10-16 | Discharge: 2016-10-16 | Disposition: A | Discharge status: Home / Self Care | Payer: Medicaid Other | Source: Ambulatory Visit | Attending: Obstetrics and Gynecology | Admitting: Obstetrics and Gynecology

## 2016-10-16 VITALS — BP 98/62 | HR 75 | Wt 173.4 lb

## 2016-10-16 DIAGNOSIS — Z0489 Encounter for examination and observation for other specified reasons: Secondary | ICD-10-CM

## 2016-10-16 DIAGNOSIS — IMO0002 Reserved for concepts with insufficient information to code with codable children: Secondary | ICD-10-CM

## 2016-10-16 DIAGNOSIS — O34211 Maternal care for low transverse scar from previous cesarean delivery: Secondary | ICD-10-CM | POA: Insufficient documentation

## 2016-10-16 DIAGNOSIS — Z362 Encounter for other antenatal screening follow-up: Secondary | ICD-10-CM | POA: Insufficient documentation

## 2016-10-16 DIAGNOSIS — Z3A19 19 weeks gestation of pregnancy: Secondary | ICD-10-CM | POA: Insufficient documentation

## 2016-10-16 DIAGNOSIS — O99212 Obesity complicating pregnancy, second trimester: Secondary | ICD-10-CM | POA: Insufficient documentation

## 2016-10-16 DIAGNOSIS — O09299 Supervision of pregnancy with other poor reproductive or obstetric history, unspecified trimester: Secondary | ICD-10-CM

## 2016-10-16 DIAGNOSIS — O09212 Supervision of pregnancy with history of pre-term labor, second trimester: Secondary | ICD-10-CM | POA: Insufficient documentation

## 2016-10-16 DIAGNOSIS — O09292 Supervision of pregnancy with other poor reproductive or obstetric history, second trimester: Secondary | ICD-10-CM | POA: Insufficient documentation

## 2016-10-29 ENCOUNTER — Ambulatory Visit (INDEPENDENT_AMBULATORY_CARE_PROVIDER_SITE_OTHER): Payer: Self-pay | Admitting: Obstetrics & Gynecology

## 2016-10-29 VITALS — BP 103/55 | HR 82 | Wt 173.2 lb

## 2016-10-29 DIAGNOSIS — O0992 Supervision of high risk pregnancy, unspecified, second trimester: Secondary | ICD-10-CM

## 2016-10-29 DIAGNOSIS — L237 Allergic contact dermatitis due to plants, except food: Secondary | ICD-10-CM

## 2016-10-29 DIAGNOSIS — Z8759 Personal history of other complications of pregnancy, childbirth and the puerperium: Secondary | ICD-10-CM

## 2016-10-29 MED ORDER — TRIAMCINOLONE ACETONIDE 0.1 % EX OINT: 1.0000 "application " | TOPICAL_OINTMENT | Freq: Two times a day (BID) | CUTANEOUS | 6 refills | Status: DC

## 2016-10-29 NOTE — Patient Instructions (Signed)
Regrese a la clinica cuando tenga su cita. Si tiene problemas o preguntas, llama a la clinica o vaya a la [facility] al [facility].    

## 2016-10-29 NOTE — Progress Notes (Signed)
   PRENATAL VISIT NOTE  Subjective:  Julie Allen is a 26 y.o. [id number] at [redacted]w[redacted]d being seen today for ongoing prenatal care.  She is currently monitored for the following issues for this high-risk pregnancy and has Prior pregnancy with fetal demise; History of severe pre-eclampsia; History of gestational diabetes mellitus (GDM); Pregnancy, supervision, high-risk; Obesity in pregnancy; BMI 34.0-34.9,adult; History of cesarean delivery x 2; Language barrier; Transaminitis; and Left ovarian cyst on her problem list.  Patient is Spanish-speaking only, Spanish interpreter present for this encounter.  Patient reports rash and itching on forearms after going to park 5 days ago.  Contractions: Not present. Vag. Bleeding: None.  Movement: Present. Denies leaking of fluid.   The following portions of the patient's history were reviewed and updated as appropriate: allergies, current medications, past family history, past medical history, past social history, past surgical history and problem list. Problem list updated.  Objective:   Vitals:   10/29/16 1049  BP: (!) 103/55  Pulse: 82  Weight: 173 lb 3.2 oz (78.6 kg)    Fetal Status: Fetal Heart Rate (bpm): 150 Fundal Height: 21 cm Movement: Present     General:  Alert, oriented and cooperative. Patient is in no acute distress.  Skin: Skin is warm and dry.  Erythematous rash noted on front surfaces on both forearms consistent with contact dermatitis   Cardiovascular: Normal heart rate noted  Respiratory: Normal respiratory effort, no problems with respiration noted  Abdomen: Soft, gravid, appropriate for gestational age. Pain/Pressure: Absent     Pelvic:  Cervical exam deferred        Extremities: Normal range of motion.  Edema: None  Mental Status: Normal mood and affect. Normal behavior. Normal judgment and thought content.   Assessment and Plan:  Pregnancy: [id number] at [redacted]w[redacted]d  1. Allergic contact dermatitis due to plants, except  food Steroid cream ordered for contact dermatitis.  - triamcinolone ointment (KENALOG) 0.1 %; Apply 1 application topically 2 (two) times daily. Apply to affected area  Dispense: 30 g; Refill: 6  2. History of severe pre-eclampsia Continue ASA. Stable BP.   3. Supervision of high risk pregnancy in second trimester Preterm labor symptoms and general obstetric precautions including but not limited to vaginal bleeding, contractions, leaking of fluid and fetal movement were reviewed in detail with the patient. Please refer to After Visit Summary for other counseling recommendations.  Return in about 4 weeks (around 11/26/2016) for OB Visit (HOB).   Tereso Newcomer, MD

## 2016-11-26 ENCOUNTER — Ambulatory Visit (INDEPENDENT_AMBULATORY_CARE_PROVIDER_SITE_OTHER): Payer: Self-pay | Admitting: Obstetrics & Gynecology

## 2016-11-26 ENCOUNTER — Encounter: Payer: Self-pay | Admitting: Obstetrics & Gynecology

## 2016-11-26 VITALS — BP 107/61 | HR 88 | Wt 176.7 lb

## 2016-11-26 DIAGNOSIS — O0992 Supervision of high risk pregnancy, unspecified, second trimester: Secondary | ICD-10-CM

## 2016-11-26 DIAGNOSIS — O09299 Supervision of pregnancy with other poor reproductive or obstetric history, unspecified trimester: Secondary | ICD-10-CM

## 2016-11-26 DIAGNOSIS — Z8759 Personal history of other complications of pregnancy, childbirth and the puerperium: Secondary | ICD-10-CM

## 2016-11-26 NOTE — Progress Notes (Signed)
   PRENATAL VISIT NOTE  Subjective:  Julie Allen is a 26 y.o. [id number] at 7255w0d being seen today for ongoing prenatal care. Due to language barrier, a Spanish video interpreter was present during the history-taking and subsequent discussion (and for part of the physical exam) with this patient.  She is currently monitored for the following issues for this high-risk pregnancy and has Prior pregnancy with fetal demise; History of severe pre-eclampsia; History of gestational diabetes mellitus (GDM); Pregnancy, supervision, high-risk; Obesity in pregnancy; BMI 34.0-34.9,adult; History of cesarean delivery x 2; Language barrier; and Transaminitis on her problem list.  Patient reports no complaints.  Contractions: Not present. Vag. Bleeding: None.  Movement: Present. Denies leaking of fluid.   The following portions of the patient's history were reviewed and updated as appropriate: allergies, current medications, past family history, past medical history, past social history, past surgical history and problem list. Problem list updated.  Objective:   Vitals:   11/26/16 1004  BP: 107/61  Pulse: 88  Weight: 176 lb 11.2 oz (80.2 kg)    Fetal Status: Fetal Heart Rate (bpm): 148   Movement: Present     General:  Alert, oriented and cooperative. Patient is in no acute distress.  Skin: Skin is warm and dry. No rash noted.   Cardiovascular: Normal heart rate noted  Respiratory: Normal respiratory effort, no problems with respiration noted  Abdomen: Soft, gravid, appropriate for gestational age. Pain/Pressure: Absent     Pelvic:  Cervical exam deferred        Extremities: Normal range of motion.  Edema: None  Mental Status: Normal mood and affect. Normal behavior. Normal judgment and thought content.   Assessment and Plan:  Pregnancy: [id number] at 6855w0d  1. Prior pregnancy with fetal demise Will start weekly BPPs at 28 weeks. Growth scan today.  - US MFM FETAL BPP WO NON STRESS; Future - US  MFM FETAL BPP WO NON STRESS; Future - US MFM OB FOLLOW UP; Future  2. History of severe pre-eclampsia Stable BP, continue aspirin  3. Supervision of high risk pregnancy in second trimester Preterm labor symptoms and general obstetric precautions including but not limited to vaginal bleeding, contractions, leaking of fluid and fetal movement were reviewed in detail with the patient. Please refer to After Visit Summary for other counseling recommendations.  Return in about 3 weeks (around 12/17/2016) for 2 hr GTT, TDap, 3rd trimester labs, CMET, OB Visit (HOB).   Tereso NewcomerUgonna A Anyanwu, MD

## 2016-11-26 NOTE — Patient Instructions (Addendum)
Regrese a la clinica cuando tenga su cita. Si tiene problemas o preguntas, llama a la clinica o vaya a la sala de [place].  Vacuna Tdap (contra la difteria, el ttanos y [place]): Lo que debe saber (Tdap Vaccine [Tetanus, Diphtheria, and Pertussis]: What You Need to Know) 1. Por qu vacunarse? El ttanos, la difteria y la tosferina son enfermedades muy graves. La vacuna Tdap nos puede proteger de estas enfermedades. Adems, la vacuna Tdap que se aplica a las Chemical engineer a los bebs recin nacidos contra la tosferina. En la actualidad, el [place] (trismo) es una enfermedad poco frecuente en los [place]. Provoca la contraccin y el endurecimiento dolorosos de los msculos, por lo general, de todo el cuerpo.  Puede causar el endurecimiento de los msculos de la cabeza y el cuello, de modo que impide abrir la boca, tragar y en algunos casos, Ambulance person. El ttanos causa la muerte de aproximadamente 1de cada 10personas que contraen la infeccin, incluso despus de que reciben la mejor atencin mdica. La DIFTERIA tambin es poco frecuente en los [place] [employer]. Puede causar la formacin de una membrana gruesa en la parte posterior de la garganta.  Esto tiene como consecuencia problemas respiratorios, insuficiencia cardaca, parlisis y [place]. La TOSFERINA (tos convulsa) provoca episodios de tos intensa que pueden dificultar la respiracin y provocar vmitos y trastornos del sueo.  Tambin puede causar prdida de peso, incontinencia y fractura de [place]. Dos de cada 100 adolescentes y 5 de cada 100 adultos con tosferina deben ser hospitalizados o tienen complicaciones, que podran incluir neumona y [place]. Estas enfermedades son provocadas por bacterias. La difteria y la tosferina se contagian de Ardelia Mems persona a otra a travs de las secreciones de la tos o el estornudo. El ttanos ingresa al organismo a travs de cortes, rasguos o  heridas. Antes de las vacunas, en los [place] se informaban 200000 casos de difteria, 200000 casos de tosferina y cientos de casos de ttanos cada ao. Desde el inicio de la vacunacin, los informes de casos de ttanos y difteria han disminuido alrededor del 99%, y de tosferina, alrededor del 80%. 2. Edward Jolly Tdap La vacuna Tdap protege a adolescentes y adultos contra el ttanos, la difteria y la tosferina. Una dosis de Tdap se administra a los 37 o 12 aos. Las [place] [employer] no recibieron la vacuna Tdap a esa edad deben recibirla tan pronto como sea posible. Es muy importante que los mdicos y todos aquellos que tengan contacto cercano con bebs menores de 84meses reciban la vacuna Tdap. Las mujeres deben recibir una dosis de Tdap en cada Media planner, para proteger al recin nacido de la tosferina. Los nios tienen mayor riesgo de complicaciones graves y potencialmente mortales debido a la tosferina. Otra vacuna llamada Td protege contra el ttanos y la difteria, pero no contra la tosferina. Todos deben recibir una dosis de refuerzo de Td cada 10 aos. La Tdap puede aplicarse como uno de estos refuerzos si nunca antes recibi esta vacuna. Tambin se puede aplicar despus de un corte o quemadura grave para prevenir la infeccin por ttanos. El mdico o la persona que le aplique la vacuna puede darle ms informacin al [employer]. La Tdap puede administrarse de manera segura simultneamente con otras vacunas. 3. Algunas personas no deben recibir la [employer] persona que alguna vez tuvo una reaccin alrgica potencialmente mortal a Ardelia Mems dosis previa de cualquier vacuna contra el ttanos, la difteria o la tosferina, O que  tenga Obie Dredge grave a cualquiera de los componentes de esta vacuna, no debe recibir la vacuna Tdap. Informe a la persona que le aplica la vacuna si tiene cualquier alergia grave.  Una persona que estuvo en estado de coma o sufri mltiples convulsiones en el trmino de los 7das  despus de recibir una dosis de DTP o DTaP, o una dosis previa de Tdap, no debe recibir la vacuna Tdap, salvo que se haya encontrado otra causa que no fuera la vacuna. An puede recibir la Td.  Consulte con su mdico si:  tiene convulsiones u otro problema del sistema nervioso,  tuvo hinchazn o dolor intenso despus de cualquier vacuna contra la difteria o el ttanos,  alguna vez ha sufrido el sndrome de [place],  no se siente Pharmacologist en que se ha programado la vacuna. 4. Riesgos Con cualquier medicamento, incluyendo las vacunas, existe la posibilidad de que aparezcan efectos secundarios. Suelen ser leves y desaparecen por s solos. Tambin son posibles las reacciones graves, pero en raras ocasiones. La [employer] de las personas a las que se les aplica la vacuna Tdap no tienen ningn problema. Problemas leves despus de la vacuna Tdap (No interfirieron en otras actividades)  Dolor en el lugar donde se aplic la vacuna (alrededor de 3 de cada 4 adolescentes o 2 de cada 3 adultos).  Enrojecimiento o hinchazn en el lugar donde se aplic la vacuna (1 de cada 5 personas).  Fiebre leve de al menos 100,37F (38C) (hasta alrededor de 1 cada 25 adolescentes o 1 de cada 100 adultos).  Dolor de cabeza (alrededor de 3 o 4 de cada 10 personas).  Cansancio (alrededor de 1 de cada 3 o 4 personas).  Nuseas, vmitos, diarrea, dolor de estmago (hasta 1 de cada 4 adolescentes o 1 de cada 10 adultos).  Escalofros, dolores articulares (alrededor de 1de cada 10personas).  Dolores corporales (alrededor de 1de cada 3 o 4personas).  Erupcin cutnea, inflamacin de los ganglios (poco frecuente). Problemas moderados despus de recibir la vacuna Tdap (Interfirieron en otras actividades, pero no requirieron atencin mdica)  Management consultant donde se aplic la vacuna (hasta 1de cada 5 o 6).  Enrojecimiento o inflamacin en el lugar donde se aplic la vacuna (hasta alrededor de 1 de  cada 16adolescentes o 1 de cada 12adultos).  Fiebre de ms de 102F (38,8C) (alrededor de 1 de cada 100 adolescentes o 1 de cada 250 adultos).  Dolor de cabeza (alrededor de 1de cada 7adolescentes o 1de cada 10adultos).  Nuseas, vmitos, diarrea, dolor de estmago (hasta 1 o 3 de cada 100 personas).  Hinchazn de todo el brazo en el que se aplic la vacuna (hasta alrededor de 1de cada 500personas). Problemas graves despus de la vacuna Tdap (Impidieron Optometrist las actividades habituales; requirieron atencin mdica)  Inflamacin, dolor intenso, sangrado y enrojecimiento en el brazo en que se aplic la vacuna (poco frecuente). Problemas que podran ocurrir despus de cualquier vacuna:  Las personas a veces se desmayan despus de un procedimiento mdico, incluida la vacunacin. Si permanece sentado o recostado durante 15 minutos puede ayudar a [employer] y las lesiones causadas por las cadas. Informe al mdico si se siente mareado, tiene cambios en la visin o zumbidos en los odos.  Algunas personas sienten un dolor intenso en el hombro y tienen dificultad para mover el brazo donde se coloc la vacuna. Esto sucede con muy poca frecuencia.  Cualquier medicamento puede causar una reaccin alrgica grave. Dichas reacciones son Orlene Erm  poco frecuentes con una vacuna (se calcula que menos de 1en un milln de dosis) y se producen de unos minutos a unas horas despus de Arts development officerla administracin. Al igual que con cualquier Automatic Datamedicamento, existe una probabilidad muy remota de que una vacuna cause una lesin grave o la [place]muerte. Se controla permanentemente la seguridad de las vacunas. Para obtener ms informacin, visite: http://floyd.org/www.cdc.gov/vaccinesafety/. 5. Qu pasa si hay un problema grave? A qu signos debo estar atento?  Observe todo lo que le preocupe, como signos de una reaccin alrgica grave, fiebre muy alta o comportamiento fuera de lo normal.  Los signos de una reaccin alrgica grave  pueden incluir ronchas, hinchazn de la cara y la garganta, dificultad para respirar, latidos cardacos acelerados, mareos y debilidad. Generalmente, estos comenzaran entre unos pocos minutos y algunas horas despus de la vacunacin. Qu debo hacer?  Si usted piensa que se trata de una reaccin alrgica grave o de otra emergencia que no puede esperar, llame al 911 o lleve a la persona al hospital ms cercano. Sino, llame a su mdico.  Despus, la reaccin debe informarse al [address] Informacin sobre Efectos Adversos de las BanksVacunas (Vaccine Adverse Event Reporting System, VAERS). Su mdico puede presentar este informe, o puede hacerlo usted mismo a travs del sitio web de VAERS, en www.vaers.LAgents.nohhs.gov, o llamando al [phone]. VAERS no brinda recomendaciones mdicas. 6. SunTrustPrograma Nacional de Compensacin de Daos por American Electric PowerVacunas El ShawnachesterPrograma Nacional de Compensacin de Daos por Administrator, artsVacunas (National Vaccine Injury Compensation Program, VICP) es un programa federal que fue creado para Patent examinercompensar a las personas que puedan haber sufrido daos al recibir ciertas vacunas. Aquellas personas que consideren que han sufrido un dao como consecuencia de una vacuna y [place]urasquieran saber ms acerca del programa y de cmo presentar Roslynn Ambleuna denuncia, pueden llamar al [phone] o visitar su sitio web en SpiritualWord.atwww.hrsa.gov/vaccinecompensation. Hay un lmite de tiempo para presentar un reclamo de compensacin. 7. Cmo puedo obtener ms informacin?  Consulte a su mdico. Este puede darle el prospecto de la vacuna o recomendarle otras fuentes de informacin.  Comunquese con el servicio de salud de su localidad o [address] estado.  Comunquese con los Centros para Air traffic controllerel Control y la Prevencin de Child psychotherapistnfermedades (Centers for Disease Control and Prevention , CDC).  Llame al [phone] (1-800-CDC-INFO), o  visite el sitio web Hartford Financialde los CDC en PicCapture.uywww.cdc.gov/vaccines. Declaracin de informacin sobre la vacuna contra la difteria, el ttanos y  la tosferina (Tdap) de los CDC (24/02/15) Esta informacin no tiene Theme park managercomo fin reemplazar el consejo del mdico. Asegrese de hacerle al mdico cualquier pregunta que tenga. Document Released: 06/29/2012 Document Revised: 08/03/2014 Document Reviewed: 10/25/2013 Elsevier Interactive Patient Education  2017 Elsevier Inc.   Southern CompanyEleccin del mtodo anticonceptivo (Contraception Choices) La anticoncepcin (control de la natalidad) es el uso de cualquier mtodo o dispositivo para Location managerevitar el embarazo. A continuacin se indican algunos de esos mtodos. MTODOS HORMONALES  El Implante contraconceptivo consiste en un tubo plstico delgado que contiene la hormona progesterona. No contiene estrgenos. El mdico inserta el tubo en la parte interna del brazo. El tubo puede Geneticist, molecularpermanecer en el lugar durante 3 aos. Despus de los 3 aos debe retirarse. El implante impide que los ovarios liberen vulos (ovulacin), espesa el moco cervical, lo que evita que los espermatozoides ingresen al tero y hace ms delgada la membrana que cubre el interior del tero.  Inyecciones de progesterona sola: las Insurance underwriteradministra el mdico cada 3 meses para Location managerevitar el embarazo. La progesterona sinttica impide que los ovarios liberen  vulos. Tambin hacen que el moco cervical se espese y modifique el tejido de recubrimiento interno del tero. Esto hace ms difcil que los espermatozoides sobrevivan en el tero.  Las pldoras anticonceptivas contienen estrgenos y Education officer, museum. Su funcin es [employer] ovarios liberen vulos (ovulacin). Las hormonas de los anticonceptivos orales hacen que el moco cervical se haga ms espeso, lo que evita que el esperma ingrese al tero. Las pldoras anticonceptivas son recetadas por el mdico.Tambin se utilizan para tratar los perodos menstruales abundantes.  Minipldora: este tipo de pldora anticonceptiva contiene slo hormona progesterona. Deben tomarse todos los [address] del mes y debe recetarlas el  mdico.  El parche de control de natalidad: contiene hormonas similares a las que contienen las pldoras anticonceptivas. Deben cambiarse una vez por semana y se utilizan bajo prescripcin mdica.  Anillo vaginal: contiene hormonas similares a las que contienen las pldoras anticonceptivas. Se deja colocado durante tres semanas, se lo retira durante 1 semana y luego se coloca uno nuevo. La paciente debe sentirse cmoda al insertar y retirar el anillo de la vagina.Es necesaria la prescripcin mdica.  Anticonceptivos de emergencia: son mtodos para evitar un embarazo despus de Neomia Dear relacin sexual sin proteccin. Esta pldora puede tomarse inmediatamente despus de Child psychotherapist sexuales o hasta 5 [place] de haber tenido sexo sin proteccin. Es ms efectiva si se toma poco tiempo despus de la relacin sexual. Los anticonceptivos de emergencia estn disponibles sin prescripcin mdica. Consltelo con su farmacutico. No use los anticonceptivos de emergencia como nico mtodo anticonceptivo. MTODOS DE Lenis Noon  Condn masculino: es una vaina delgada (ltex o goma) que se coloca cubriendo al pene durante el acto sexual. Deri Fuelling con espermicida para aumentar la efectividad.  Condn femenino. Es una funda delicada y blanda que se adapta holgadamente a la vagina antes de las Clinical research associate.  Diafragma: es una barrera de ltex redonda y suave que debe ser recomendado por un profesional. Se inserta en la vagina, junto con un gel espermicida. Debe insertarse antes de Management consultant. Debe dejar el diafragma colocado en la vagina durante 6 a 8 horas despus de la relacin sexual.  Capuchn cervical: es una barrera de ltex o taza plstica redonda y [place] que cubre el cuello del tero y debe ser colocada por un mdico. Puede dejarlo colocado en la vagina hasta 48 horas despus de las Clinical research associate.  Esponja: es una pieza blanda y circular de espuma de poliuretano. Contiene un  espermicida. Se inserta en la vagina despus de mojarla y antes de las [employer].  Espermicidas: son sustancias qumicas que matan o bloquean al esperma y no lo dejan ingresar al cuello del tero y al tero. Vienen en forma de cremas, geles, supositorios, espuma o comprimidos. No es necesario tener Emergency planning/management officer. Se insertan en la vagina con un aplicador antes de Management consultant. El proceso debe repetirse cada vez que tiene relaciones sexuales. ANTICONCEPTIVOS INTRAUTERINOS  Dispositivo intrauterino (DIU) es un dispositivo en forma de T que se coloca en el tero durante el perodo menstrual, para Location manager. Hay dos tipos:  DIU de cobre: este tipo de DIU est recubierto con un alambre de cobre y se inserta dentro del tero. El cobre hace que el tero y las trompas de Falopio produzcan un liquido que [employer] espermatozoides. Puede permanecer colocado durante 10 aos.  DIU con hormona: este tipo de DIU contiene la hormona progestina (progesterona sinttica). La hormona espesa el moco cervical y evita que los espermatozoides ingresen  al tero y tambin afina la membrana que cubre el tero para evitar la implantacin del vulo fertilizado. La hormona debilita o destruye los espermatozoides que ingresan al tero. Puede Geneticist, molecular durante 3-5 aos, segn el tipo de DIU que se [place]. MTODOS ANTICONCEPTIVOS PERMANENTES  Ligadura de trompas en la mujer: se realiza sellando, atando u obstruyendo quirrgicamente las trompas de Falopio lo que impide que el vulo descienda hacia el tero.  Esterilizacin histeroscpica: Implica la colocacin de un pequeo espiral o la insercin en cada trompa de Falopio. El mdico utiliza una tcnica llamada histeroscopa para Primary school teacher procedimiento. El dispositivo produce la formacin de tejido Designer, television/film set. Esto da como resultado una obstruccin permanente de las trompas de Falopio, de modo que la esperma no pueda fertilizar el  vulo. Demora alrededor de 3 meses despus del procedimiento hasta que el conducto se obstruye. Tendr que usar otro mtodo anticonceptivo durante al menos 3 meses.  Esterilizacin masculina: se realiza ligando los conductos por los que pasan los espermatozoides (vasectoma).Esto impide que el esperma ingrese a la vagina durante el acto sexual. Luego del procedimiento, el hombre puede eyacular lquido (semen). MTODOS DE PLANIFICACIN NATURAL  Planificacin familiar natural: consiste en no Management consultant o usar un mtodo de barrera (condn, Manuelito, capuchn cervical) en los [employer] la mujer podra quedar [place].  Mtodo de calendario: consiste en el seguimiento de la duracin de cada ciclo menstrual y la identificacin de los perodos frtiles.  Mtodo de ovulacin: Paramedic las relaciones sexuales durante la ovulacin.  Mtodo sintotrmico: Advertising copywriter sexuales en la poca en la que se est ovulando, utilizando un termmetro y tendiendo en cuenta los sntomas de la ovulacin.  Mtodo postovulacin: Youth worker las relaciones sexuales para despus de haber ovulado. Independientemente del tipo o mtodo anticonceptivo que usted elija, es importante que use condones para protegerse contra las infecciones de transmisin sexual (ETS). Hable con su mdico con respecto a qu mtodo anticonceptivo es el ms apropiado para usted. Esta informacin no tiene Theme park manager el consejo del mdico. Asegrese de hacerle al mdico cualquier pregunta que tenga. Document Released: 07/13/2005 Document Revised: 03/15/2013 Document Reviewed: 01/05/2013 Elsevier Interactive Patient Education  2017 [employer].

## 2016-11-27 ENCOUNTER — Ambulatory Visit (HOSPITAL_COMMUNITY)
Admission: RE | Admit: 2016-11-27 | Discharge: 2016-11-27 | Disposition: A | Discharge status: Home / Self Care | Payer: Self-pay | Source: Ambulatory Visit | Attending: Obstetrics & Gynecology | Admitting: Obstetrics & Gynecology

## 2016-11-27 ENCOUNTER — Encounter (HOSPITAL_COMMUNITY): Payer: Self-pay

## 2016-12-17 ENCOUNTER — Ambulatory Visit (HOSPITAL_COMMUNITY)
Admission: RE | Admit: 2016-12-17 | Discharge: 2016-12-17 | Disposition: A | Discharge status: Home / Self Care | Payer: Self-pay | Source: Ambulatory Visit | Attending: Obstetrics & Gynecology | Admitting: Obstetrics & Gynecology

## 2016-12-17 DIAGNOSIS — O99213 Obesity complicating pregnancy, third trimester: Secondary | ICD-10-CM | POA: Insufficient documentation

## 2016-12-17 DIAGNOSIS — E669 Obesity, unspecified: Secondary | ICD-10-CM | POA: Insufficient documentation

## 2016-12-17 DIAGNOSIS — O34219 Maternal care for unspecified type scar from previous cesarean delivery: Secondary | ICD-10-CM | POA: Insufficient documentation

## 2016-12-17 DIAGNOSIS — O09293 Supervision of pregnancy with other poor reproductive or obstetric history, third trimester: Secondary | ICD-10-CM | POA: Insufficient documentation

## 2016-12-17 DIAGNOSIS — O09299 Supervision of pregnancy with other poor reproductive or obstetric history, unspecified trimester: Secondary | ICD-10-CM

## 2016-12-17 DIAGNOSIS — Z3A28 28 weeks gestation of pregnancy: Secondary | ICD-10-CM | POA: Insufficient documentation

## 2016-12-17 DIAGNOSIS — O2620 Pregnancy care for patient with recurrent pregnancy loss, unspecified trimester: Secondary | ICD-10-CM | POA: Insufficient documentation

## 2016-12-17 DIAGNOSIS — O09893 Supervision of other high risk pregnancies, third trimester: Secondary | ICD-10-CM | POA: Insufficient documentation

## 2016-12-18 ENCOUNTER — Encounter: Payer: Self-pay | Admitting: Obstetrics & Gynecology

## 2016-12-24 ENCOUNTER — Other Ambulatory Visit (HOSPITAL_COMMUNITY): Payer: Self-pay | Admitting: *Deleted

## 2016-12-24 ENCOUNTER — Ambulatory Visit (HOSPITAL_COMMUNITY)
Admission: RE | Admit: 2016-12-24 | Discharge: 2016-12-24 | Disposition: A | Discharge status: Home / Self Care | Payer: Medicaid Other | Source: Ambulatory Visit | Attending: Obstetrics & Gynecology | Admitting: Obstetrics & Gynecology

## 2016-12-24 ENCOUNTER — Encounter (HOSPITAL_COMMUNITY): Payer: Self-pay

## 2016-12-24 DIAGNOSIS — O09293 Supervision of pregnancy with other poor reproductive or obstetric history, third trimester: Secondary | ICD-10-CM | POA: Insufficient documentation

## 2016-12-24 DIAGNOSIS — Z3A29 29 weeks gestation of pregnancy: Secondary | ICD-10-CM | POA: Insufficient documentation

## 2016-12-24 DIAGNOSIS — O09292 Supervision of pregnancy with other poor reproductive or obstetric history, second trimester: Secondary | ICD-10-CM

## 2016-12-24 DIAGNOSIS — O09299 Supervision of pregnancy with other poor reproductive or obstetric history, unspecified trimester: Secondary | ICD-10-CM

## 2016-12-31 ENCOUNTER — Other Ambulatory Visit (HOSPITAL_COMMUNITY): Payer: Self-pay | Admitting: Maternal and Fetal Medicine

## 2016-12-31 ENCOUNTER — Ambulatory Visit (HOSPITAL_COMMUNITY)
Admission: RE | Admit: 2016-12-31 | Discharge: 2016-12-31 | Disposition: A | Discharge status: Home / Self Care | Payer: Medicaid Other | Source: Ambulatory Visit | Attending: Obstetrics & Gynecology | Admitting: Obstetrics & Gynecology

## 2016-12-31 ENCOUNTER — Encounter (HOSPITAL_COMMUNITY): Payer: Self-pay

## 2016-12-31 DIAGNOSIS — Z3A30 30 weeks gestation of pregnancy: Secondary | ICD-10-CM | POA: Insufficient documentation

## 2016-12-31 DIAGNOSIS — O99212 Obesity complicating pregnancy, second trimester: Secondary | ICD-10-CM

## 2016-12-31 DIAGNOSIS — O2620 Pregnancy care for patient with recurrent pregnancy loss, unspecified trimester: Secondary | ICD-10-CM

## 2016-12-31 DIAGNOSIS — O99213 Obesity complicating pregnancy, third trimester: Secondary | ICD-10-CM | POA: Insufficient documentation

## 2016-12-31 DIAGNOSIS — O09213 Supervision of pregnancy with history of pre-term labor, third trimester: Secondary | ICD-10-CM | POA: Insufficient documentation

## 2016-12-31 DIAGNOSIS — O0992 Supervision of high risk pregnancy, unspecified, second trimester: Secondary | ICD-10-CM

## 2016-12-31 DIAGNOSIS — E669 Obesity, unspecified: Secondary | ICD-10-CM | POA: Insufficient documentation

## 2016-12-31 DIAGNOSIS — O09299 Supervision of pregnancy with other poor reproductive or obstetric history, unspecified trimester: Secondary | ICD-10-CM

## 2016-12-31 DIAGNOSIS — O09292 Supervision of pregnancy with other poor reproductive or obstetric history, second trimester: Secondary | ICD-10-CM

## 2016-12-31 DIAGNOSIS — O09293 Supervision of pregnancy with other poor reproductive or obstetric history, third trimester: Secondary | ICD-10-CM | POA: Insufficient documentation

## 2017-01-01 ENCOUNTER — Ambulatory Visit (INDEPENDENT_AMBULATORY_CARE_PROVIDER_SITE_OTHER): Payer: Self-pay | Admitting: Family Medicine

## 2017-01-01 VITALS — BP 103/65 | HR 83 | Wt 180.3 lb

## 2017-01-01 DIAGNOSIS — O09293 Supervision of pregnancy with other poor reproductive or obstetric history, third trimester: Secondary | ICD-10-CM

## 2017-01-01 DIAGNOSIS — Z98891 History of uterine scar from previous surgery: Secondary | ICD-10-CM

## 2017-01-01 DIAGNOSIS — O0993 Supervision of high risk pregnancy, unspecified, third trimester: Secondary | ICD-10-CM

## 2017-01-01 DIAGNOSIS — O34219 Maternal care for unspecified type scar from previous cesarean delivery: Secondary | ICD-10-CM

## 2017-01-01 DIAGNOSIS — Z8759 Personal history of other complications of pregnancy, childbirth and the puerperium: Secondary | ICD-10-CM

## 2017-01-01 DIAGNOSIS — O09299 Supervision of pregnancy with other poor reproductive or obstetric history, unspecified trimester: Secondary | ICD-10-CM

## 2017-01-01 DIAGNOSIS — Z23 Encounter for immunization: Secondary | ICD-10-CM

## 2017-01-01 NOTE — Progress Notes (Signed)
   PRENATAL VISIT NOTE  Subjective:  Julie Allen is a 26 y.o. [id number] at 44w1dbeing seen today for ongoing prenatal care.  She is currently monitored for the following issues for this high-risk pregnancy and has Prior pregnancy with fetal demise; History of severe pre-eclampsia; History of gestational diabetes mellitus (GDM); Pregnancy, supervision, high-risk; Obesity in pregnancy; BMI 34.0-34.9,adult; History of cesarean delivery x 2; Language barrier; and Transaminitis on her problem list.  Patient reports no complaints.  Contractions: Not present. Vag. Bleeding: None.  Movement: Present. Denies leaking of fluid.   The following portions of the patient's history were reviewed and updated as appropriate: allergies, current medications, past family history, past medical history, past social history, past surgical history and problem list. Problem list updated.  Objective:   Vitals:   01/01/17 0818  BP: 103/65  Pulse: 83  Weight: 180 lb 4.8 oz (81.8 kg)    Fetal Status: Fetal Heart Rate (bpm): 135 Fundal Height: 29 cm Movement: Present     General:  Alert, oriented and cooperative. Patient is in no acute distress.  Skin: Skin is warm and dry. No rash noted.   Cardiovascular: Normal heart rate noted  Respiratory: Normal respiratory effort, no problems with respiration noted  Abdomen: Soft, gravid, appropriate for gestational age. Pain/Pressure: Absent     Pelvic:  Cervical exam deferred        Extremities: Normal range of motion.  Edema: None  Mental Status: Normal mood and affect. Normal behavior. Normal judgment and thought content.   Assessment and Plan:  Pregnancy: [id number] 345w1d1. Supervision of high risk pregnancy, antepartum, third trimester 28 week labs - Glucose Tolerance, 2 Hours w/1 Hour - HIV antibody (with reflex) - RPR - CBC - Comp Met (CMET)  2. Need for Tdap vaccination - Tdap vaccine greater than or equal to 7yo IM  3. History of cesarean  delivery x 2  4. History of severe pre-eclampsia Continue daily ASA. BP controlled.  5. Prior pregnancy with fetal demise Continue weekly BPP. Growth in 2 weeks.   Preterm labor symptoms and general obstetric precautions including but not limited to vaginal bleeding, contractions, leaking of fluid and fetal movement were reviewed in detail with the patient. Please refer to After Visit Summary for other counseling recommendations.  No Follow-up on file.   JaTruett MainlandDO

## 2017-01-01 NOTE — Progress Notes (Signed)
28 week labs/tdap today Spanish video interpreter "Katherin" [phone] used for visit Breastfeeding tip reviewed

## 2017-01-02 LAB — CBC
Hematocrit: 36.2 % (ref 34.0–46.6)
Hemoglobin: 11.2 g/dL (ref 11.1–15.9)
MCH: 25.4 pg — AB (ref 26.6–33.0)
MCHC: 30.9 g/dL — AB (ref 31.5–35.7)
MCV: 82 fL (ref 79–97)
Platelets: 304 10*3/uL (ref 150–379)
RBC: 4.41 x10E6/uL (ref 3.77–5.28)
RDW: 14.5 % (ref 12.3–15.4)
WBC: 8.4 10*3/uL (ref 3.4–10.8)

## 2017-01-02 LAB — COMPREHENSIVE METABOLIC PANEL
ALK PHOS: 266 IU/L — AB (ref 39–117)
ALT: 38 IU/L — ABNORMAL HIGH (ref 0–32)
AST: 30 IU/L (ref 0–40)
Albumin/Globulin Ratio: 1.1 — ABNORMAL LOW (ref 1.2–2.2)
Albumin: 3.3 g/dL — ABNORMAL LOW (ref 3.5–5.5)
BUN/Creatinine Ratio: 11 (ref 9–23)
BUN: 5 mg/dL — AB (ref 6–20)
Bilirubin Total: <0.2 mg/dL (ref 0.0–1.2)
CO2: 19 mmol/L (ref 18–29)
Calcium: 8.6 mg/dL — ABNORMAL LOW (ref 8.7–10.2)
Chloride: 105 mmol/L (ref 96–106)
Creatinine, Ser: 0.46 mg/dL — ABNORMAL LOW (ref 0.57–1.00)
GFR calc non Af Amer: 138 mL/min/{1.73_m2} (ref >59)
GFR, EST AFRICAN AMERICAN: 159 mL/min/{1.73_m2} (ref >59)
GLUCOSE: 98 mg/dL (ref 65–99)
Globulin, Total: 3 g/dL (ref 1.5–4.5)
POTASSIUM: 4 mmol/L (ref 3.5–5.2)
Sodium: 139 mmol/L (ref 134–144)
TOTAL PROTEIN: 6.3 g/dL (ref 6.0–8.5)

## 2017-01-02 LAB — GLUCOSE TOLERANCE, 2 HOURS W/ 1HR
GLUCOSE, 1 HOUR: 182 mg/dL — AB (ref 65–179)
GLUCOSE, 2 HOUR: 153 mg/dL — AB (ref 65–152)
Glucose, Fasting: 101 mg/dL — ABNORMAL HIGH (ref 65–91)

## 2017-01-02 LAB — HIV ANTIBODY (ROUTINE TESTING W REFLEX): HIV SCREEN 4TH GENERATION: NONREACTIVE

## 2017-01-02 LAB — RPR: RPR Ser Ql: NONREACTIVE

## 2017-01-05 ENCOUNTER — Telehealth: Payer: Self-pay | Admitting: General Practice

## 2017-01-05 DIAGNOSIS — O24415 Gestational diabetes mellitus in pregnancy, controlled by oral hypoglycemic drugs: Secondary | ICD-10-CM

## 2017-01-05 NOTE — Telephone Encounter (Signed)
Scheduled appt for tomorrow @ 2pm. Called patient with pacific interpreter [phone] and informed her of results & appt. Patient states she cannot make appt time due to son's appt at 1:45. I was previously informed MFM had appt at 330, so I told patient she could come then. Told patient she will need to come to our office tomorrow beforehand to pick up testing supplies. Patient verbalized understanding to all and had no questions. Called and confirmed with MFM. 330 appt is only 30 minute time slot, but should be fine for patient as she has had diabetes before and been on medication.

## 2017-01-05 NOTE — Telephone Encounter (Signed)
-----   Message from Levie HeritageJacob J Stinson, DO sent at 01/02/2017  3:21 PM EDT ----- Testing positive for Gestational diabetes. Needs diabetes education as soon as possible. Please send testing materials (meter, strips, lancets) to pharmacy.

## 2017-01-07 ENCOUNTER — Ambulatory Visit (HOSPITAL_COMMUNITY): Payer: Self-pay

## 2017-01-07 ENCOUNTER — Encounter: Payer: Self-pay | Attending: Obstetrics and Gynecology | Admitting: *Deleted

## 2017-01-07 ENCOUNTER — Ambulatory Visit (HOSPITAL_COMMUNITY)
Admission: RE | Admit: 2017-01-07 | Discharge: 2017-01-07 | Disposition: A | Discharge status: Home / Self Care | Payer: Self-pay | Source: Ambulatory Visit | Attending: Obstetrics & Gynecology | Admitting: Obstetrics & Gynecology

## 2017-01-07 ENCOUNTER — Other Ambulatory Visit (HOSPITAL_COMMUNITY): Payer: Self-pay | Admitting: Maternal and Fetal Medicine

## 2017-01-07 DIAGNOSIS — Z3A31 31 weeks gestation of pregnancy: Secondary | ICD-10-CM

## 2017-01-07 DIAGNOSIS — R7309 Other abnormal glucose: Secondary | ICD-10-CM

## 2017-01-07 DIAGNOSIS — O24410 Gestational diabetes mellitus in pregnancy, diet controlled: Secondary | ICD-10-CM

## 2017-01-07 DIAGNOSIS — O09292 Supervision of pregnancy with other poor reproductive or obstetric history, second trimester: Secondary | ICD-10-CM

## 2017-01-12 NOTE — Progress Notes (Signed)
  Patient was seen on 01/07/2017 for Gestational Diabetes self-management . Patient states history of GDM wih last pregnancy 4 years ago. She brought her new meter with her to this appointment.  Diet history obtained.The following learning objectives were met by the patient :   States the definition of Gestational Diabetes  States why dietary management is important in controlling blood glucose  Describes the effects of carbohydrates on blood glucose levels  Demonstrates ability to create a balanced meal plan  Demonstrates carbohydrate counting   States when to check blood glucose levels  Demonstrates proper blood glucose monitoring techniques  States the effect of stress and exercise on blood glucose levels  States the importance of limiting caffeine and abstaining from alcohol and smoking  Plan:  Aim for 3 Carb Choices per meal (45 grams) +/- 1 either way  Aim for 1-2 Carbs per snack Begin reading food labels for Total Carbohydrate of foods Consider  increasing your activity level by walking or other activity daily as tolerated Begin checking BG before breakfast and 2 hours after first bite of breakfast, lunch and dinner as directed by MD  Bring Log Book to every medical appointment   Take medication if directed by MD  Patient already has a meter:  And is testing pre breakfast and 2 hours each meal as directed by MD Review of Log Book shows: within target ranges  Patient instructed to monitor glucose levels: FBS: 60 - 95 mg/dl 2 hour: <120 mg/dl  Patient received the following handouts:  Nutrition Diabetes and Pregnancy  Carbohydrate Counting List  Patient will be seen for follow-up as needed.

## 2017-01-15 ENCOUNTER — Ambulatory Visit (INDEPENDENT_AMBULATORY_CARE_PROVIDER_SITE_OTHER): Payer: Self-pay | Admitting: Obstetrics and Gynecology

## 2017-01-15 VITALS — BP 111/62 | HR 86 | Wt 181.5 lb

## 2017-01-15 DIAGNOSIS — O24410 Gestational diabetes mellitus in pregnancy, diet controlled: Secondary | ICD-10-CM

## 2017-01-15 DIAGNOSIS — R740 Nonspecific elevation of levels of transaminase and lactic acid dehydrogenase [LDH]: Secondary | ICD-10-CM

## 2017-01-15 DIAGNOSIS — Z8759 Personal history of other complications of pregnancy, childbirth and the puerperium: Secondary | ICD-10-CM

## 2017-01-15 DIAGNOSIS — R7401 Elevation of levels of liver transaminase levels: Secondary | ICD-10-CM

## 2017-01-15 DIAGNOSIS — O09299 Supervision of pregnancy with other poor reproductive or obstetric history, unspecified trimester: Secondary | ICD-10-CM

## 2017-01-15 DIAGNOSIS — Z98891 History of uterine scar from previous surgery: Secondary | ICD-10-CM

## 2017-01-15 DIAGNOSIS — O24419 Gestational diabetes mellitus in pregnancy, unspecified control: Secondary | ICD-10-CM | POA: Insufficient documentation

## 2017-01-15 DIAGNOSIS — Z789 Other specified health status: Secondary | ICD-10-CM

## 2017-01-15 NOTE — Patient Instructions (Signed)
Diagnstico de diabetes mellitus gestacional  (Gestational Diabetes Mellitus, Diagnosis)  La diabetes gestacional (diabetes mellitus gestacional) es una forma de diabetes a corto plazo (temporal) que puede aparecer durante el embarazo. Este cuadro desaparece despus del parto. Puede deberse a uno de estos problemas o a ambos:   El cuerpo no produce la cantidad suficiente de una hormona llamada insulina.   El cuerpo no responde de forma normal a la insulina que produce.  La insulina permite que los ciertos azcares (glucosa) ingresen a las clulas del cuerpo. Esto le proporciona la energa. Cuando se tiene diabetes, la glucosa no puede ingresar a las clulas. Esto produce un aumento del nivel de glucosa en la sangre (hiperglucemia).  Si la diabetes se trata, es posible que ni usted ni el beb se vean afectados. El mdico fijar los objetivos del tratamiento para usted. Generalmente, los resultados de la glucemia deben ser los siguientes:   Despus de no comer durante mucho tiempo (ayunar): 95mg/dl (5,3mmol/l).   Despus de las comidas (posprandial):  ? Una hora despus de una comida: igual o menor que 140mg/dl (7,8mmol/l).  ? Dos horas despus de una comida: igual o menor que 120mg/dl (6,7mmol/l).   Nivel de A1c (hemoglobinaA1c): del 6% al 6,5%.  CUIDADOS EN EL HOGAR  Preguntas para hacerle al mdico  Puede hacer las siguientes preguntas:   Debo reunirme con un instructor para el cuidado de la diabetes?   Dnde puedo encontrar un grupo de apoyo para personas diabticas?   Qu equipos necesitar para cuidarme en casa?   Qu medicamentos para la diabetes necesito? Cundo debo tomarlos?   Con qu frecuencia debo controlarme el nivel de glucosa en la sangre?   A qu nmero puedo llamar si tengo preguntas?   Cundo es la prxima cita con el mdico?  Instrucciones generales   Tome los medicamentos de venta libre y los recetados solamente como se lo haya indicado el mdico.   Mantenga un peso  saludable durante el embarazo.   Concurra a todas las visitas de control como se lo haya indicado el mdico. Esto es importante.  SOLICITE AYUDA SI:   Su nivel de glucosa en la sangre es igual o superior a 240mg/dl (13,3mmol/dl).   Su nivel de glucosa en la sangre es igual o superior a 200mg/dl (11,1mmol/l), y tiene cetonas en la orina.   Ha estado enferma o ha tenido fiebre durante 2o ms das y no mejora.   Si tiene alguno de estos problemas durante ms de 6horas:  ? No puede comer ni beber.  ? Siente malestar estomacal (nuseas).  ? Vomita.  ? La materia fecal es lquida (diarrea).  SOLICITE AYUDA DE INMEDIATO SI:   El nivel de glucosa en la sangre est por debajo de 54mg/dl (3mmol/l).   Est confundida.   Tiene dificultad para hacer lo siguiente:  ? Pensar con claridad.  ? Respirar.   El beb se mueve menos de lo normal.   Tiene los siguientes sntomas:  ? Niveles moderados o altos de cetonas en la orina.  ? Hemorragia vaginal.  ? Secrecin de un lquido fuera de lo comn de la vagina.  ? Contracciones prematuras. Estas pueden causar una sensacin de opresin en el vientre.  Esta informacin no tiene como fin reemplazar el consejo del mdico. Asegrese de hacerle al mdico cualquier pregunta que tenga.  Document Released: 11/04/2015 Document Revised: 11/04/2015 Document Reviewed: 08/16/2015  Elsevier Interactive Patient Education  2018 Elsevier Inc[employer]

## 2017-01-15 NOTE — Progress Notes (Signed)
Subjective:  Julie Allen is a 26 y.o. [id number] at 271w1d being seen today for ongoing prenatal care.  She is currently monitored for the following issues for this high-risk pregnancy and has Prior pregnancy with fetal demise; History of severe pre-eclampsia; History of gestational diabetes mellitus (GDM); Pregnancy, supervision, high-risk; Obesity in pregnancy; BMI 34.0-34.9,adult; History of cesarean delivery x 2; Language barrier; Transaminitis; and DM (diabetes mellitus), gestational on her problem list.  Patient reports no complaints.   .  .  Movement: Present. Denies leaking of fluid.   The following portions of the patient's history were reviewed and updated as appropriate: allergies, current medications, past family history, past medical history, past social history, past surgical history and problem list. Problem list updated.  Objective:   Vitals:   01/15/17 1005  BP: 111/62  Pulse: 86  Weight: 181 lb 8 oz (82.3 kg)    Fetal Status: Fetal Heart Rate (bpm): 135   Movement: Present     General:  Alert, oriented and cooperative. Patient is in no acute distress.  Skin: Skin is warm and dry. No rash noted.   Cardiovascular: Normal heart rate noted  Respiratory: Normal respiratory effort, no problems with respiration noted  Abdomen: Soft, gravid, appropriate for gestational age. Pain/Pressure: Absent     Pelvic:  Cervical exam deferred        Extremities: Normal range of motion.     Mental Status: Normal mood and affect. Normal behavior. Normal judgment and thought content.   Urinalysis:      Assessment and Plan:  Pregnancy: [id number] at 271w1d  1. Language barrier Interrupter services used during today's office visit  2. History of cesarean delivery x 2 Will need repeat at 39 wks  3. History of severe pre-eclampsia BP stable Continue with BASA  4. Prior pregnancy with fetal demise Continue with BPP weekly   5. Diet controlled gestational diabetes mellitus (GDM) in  third trimester Fasting BS not in goal range, 2 hr PP in range Diet encouraged Pt instructed to call office with BS reults next week, if fasting not in goal range, consider starting Diabeta 2.5 mg qhs  6. Transaminitis Repeat CMP at next OV  Preterm labor symptoms and general obstetric precautions including but not limited to vaginal bleeding, contractions, leaking of fluid and fetal movement were reviewed in detail with the patient. Please refer to After Visit Summary for other counseling recommendations.  Return in about 2 weeks (around 01/29/2017) for OB visit.   Hermina StaggersErvin, Dain Laseter L, MD

## 2017-01-21 ENCOUNTER — Ambulatory Visit (HOSPITAL_COMMUNITY): Admission: RE | Admit: 2017-01-21 | Payer: Medicaid Other | Source: Ambulatory Visit

## 2017-02-01 ENCOUNTER — Encounter: Payer: Self-pay | Admitting: Family Medicine

## 2017-02-01 ENCOUNTER — Ambulatory Visit (INDEPENDENT_AMBULATORY_CARE_PROVIDER_SITE_OTHER): Payer: Self-pay | Admitting: Family Medicine

## 2017-02-01 VITALS — BP 107/70 | HR 93 | Wt 181.4 lb

## 2017-02-01 DIAGNOSIS — Z789 Other specified health status: Secondary | ICD-10-CM

## 2017-02-01 DIAGNOSIS — O0993 Supervision of high risk pregnancy, unspecified, third trimester: Secondary | ICD-10-CM

## 2017-02-01 DIAGNOSIS — Z8759 Personal history of other complications of pregnancy, childbirth and the puerperium: Secondary | ICD-10-CM

## 2017-02-01 DIAGNOSIS — O09299 Supervision of pregnancy with other poor reproductive or obstetric history, unspecified trimester: Secondary | ICD-10-CM

## 2017-02-01 DIAGNOSIS — Z603 Acculturation difficulty: Secondary | ICD-10-CM

## 2017-02-01 DIAGNOSIS — O24410 Gestational diabetes mellitus in pregnancy, diet controlled: Secondary | ICD-10-CM

## 2017-02-01 DIAGNOSIS — Z98891 History of uterine scar from previous surgery: Secondary | ICD-10-CM

## 2017-02-01 DIAGNOSIS — O09293 Supervision of pregnancy with other poor reproductive or obstetric history, third trimester: Secondary | ICD-10-CM

## 2017-02-01 LAB — POCT URINALYSIS DIP (DEVICE)
BILIRUBIN URINE: NEGATIVE
Glucose, UA: NEGATIVE mg/dL
Hgb urine dipstick: NEGATIVE
KETONES UR: NEGATIVE mg/dL
LEUKOCYTES UA: NEGATIVE
Nitrite: NEGATIVE
Protein, ur: NEGATIVE mg/dL
Specific Gravity, Urine: <=1.005 (ref 1.005–1.030)
Urobilinogen, UA: 0.2 mg/dL (ref 0.0–1.0)
pH: 7 (ref 5.0–8.0)

## 2017-02-01 NOTE — Progress Notes (Signed)
[employer] interpreter [place] [phone]

## 2017-02-01 NOTE — Progress Notes (Signed)
Subjective:  Ezzie DuralLorena Abarca-Tapia is a 26 y.o. [id number] at 6765w4d being seen today for ongoing prenatal care.  She is currently monitored for the following issues for this high-risk pregnancy and has Prior pregnancy with fetal demise; History of severe pre-eclampsia; History of gestational diabetes mellitus (GDM); Pregnancy, supervision, high-risk; Obesity in pregnancy; BMI 34.0-34.9,adult; History of cesarean delivery x 2; Language barrier; Transaminitis; and DM (diabetes mellitus), gestational on her problem list.  GDM: Patient diet controlled.  Reports no hypoglycemic episodes.  Did not bring logbook Fasting: <90 2hr PP: <115  Patient reports no complaints.  Contractions: Not present. Vag. Bleeding: None.  Movement: Present. Denies leaking of fluid.   The following portions of the patient's history were reviewed and updated as appropriate: allergies, current medications, past family history, past medical history, past social history, past surgical history and problem list. Problem list updated.  Objective:   Vitals:   02/01/17 1048  BP: 107/70  Pulse: 93  Weight: 181 lb 6.4 oz (82.3 kg)    Fetal Status: Fetal Heart Rate (bpm): 156   Movement: Present     General:  Alert, oriented and cooperative. Patient is in no acute distress.  Skin: Skin is warm and dry. No rash noted.   Cardiovascular: Normal heart rate noted  Respiratory: Normal respiratory effort, no problems with respiration noted  Abdomen: Soft, gravid, appropriate for gestational age. Pain/Pressure: Absent     Pelvic: Vag. Bleeding: None     Cervical exam deferred        Extremities: Normal range of motion.  Edema: None  Mental Status: Normal mood and affect. Normal behavior. Normal judgment and thought content.   Urinalysis:      Assessment and Plan:  Pregnancy: [id number] at 6165w4d  1. Supervision of high risk pregnancy, antepartum, third trimester FHT and FH normal  2. Diet controlled gestational diabetes mellitus  (GDM) in third trimester Continue diet controll  3. History of cesarean delivery x 2 Her OB history lists both of her cesarean sections as "classical", however, after reviewing the surgical note from her last c/s, it was preformed as a LTCS. Unfortunately, we do not have access to her c/s note from [place], but she states that she was told that she could have a normal delivery and was never told to have early deliveries. Will see if provider who did her surgery remembers incision type, but will schedule for 39 weeks.  4. History of severe pre-eclampsia Continue ASA  5. Language barrier Interpreter used  6. Prior pregnancy with fetal demise Weekly BPP - missed last one, will reschedule.   Term labor symptoms and general obstetric precautions including but not limited to vaginal bleeding, contractions, leaking of fluid and fetal movement were reviewed in detail with the patient. Please refer to After Visit Summary for other counseling recommendations.  No Follow-up on file.   Levie HeritageStinson, Jacob J, DO

## 2017-02-05 ENCOUNTER — Encounter (HOSPITAL_COMMUNITY): Payer: Self-pay

## 2017-02-05 ENCOUNTER — Ambulatory Visit (HOSPITAL_COMMUNITY)
Admission: RE | Admit: 2017-02-05 | Discharge: 2017-02-05 | Disposition: A | Discharge status: Home / Self Care | Payer: Self-pay | Source: Ambulatory Visit | Attending: Obstetrics & Gynecology | Admitting: Obstetrics & Gynecology

## 2017-02-05 ENCOUNTER — Other Ambulatory Visit (HOSPITAL_COMMUNITY): Payer: Self-pay | Admitting: Maternal and Fetal Medicine

## 2017-02-05 DIAGNOSIS — O09292 Supervision of pregnancy with other poor reproductive or obstetric history, second trimester: Secondary | ICD-10-CM

## 2017-02-05 DIAGNOSIS — O24410 Gestational diabetes mellitus in pregnancy, diet controlled: Secondary | ICD-10-CM

## 2017-02-05 DIAGNOSIS — O99213 Obesity complicating pregnancy, third trimester: Secondary | ICD-10-CM

## 2017-02-05 DIAGNOSIS — O09299 Supervision of pregnancy with other poor reproductive or obstetric history, unspecified trimester: Secondary | ICD-10-CM

## 2017-02-05 DIAGNOSIS — O09213 Supervision of pregnancy with history of pre-term labor, third trimester: Secondary | ICD-10-CM

## 2017-02-05 DIAGNOSIS — Z3A35 35 weeks gestation of pregnancy: Secondary | ICD-10-CM | POA: Insufficient documentation

## 2017-02-05 DIAGNOSIS — Z8759 Personal history of other complications of pregnancy, childbirth and the puerperium: Secondary | ICD-10-CM

## 2017-02-05 DIAGNOSIS — O09293 Supervision of pregnancy with other poor reproductive or obstetric history, third trimester: Secondary | ICD-10-CM | POA: Insufficient documentation

## 2017-02-05 DIAGNOSIS — O34211 Maternal care for low transverse scar from previous cesarean delivery: Secondary | ICD-10-CM | POA: Insufficient documentation

## 2017-02-05 DIAGNOSIS — O2623 Pregnancy care for patient with recurrent pregnancy loss, third trimester: Secondary | ICD-10-CM | POA: Insufficient documentation

## 2017-02-05 DIAGNOSIS — O34219 Maternal care for unspecified type scar from previous cesarean delivery: Secondary | ICD-10-CM

## 2017-02-05 DIAGNOSIS — O0993 Supervision of high risk pregnancy, unspecified, third trimester: Secondary | ICD-10-CM

## 2017-02-05 NOTE — Progress Notes (Signed)
In communication with Dr Emelda FearFerguson, he believes that just a scar revision was done and that the incision on the uterus was low transverse.  Julie Allen, Julie Allen J, DO 02/05/2017 9:33 AM

## 2017-02-08 ENCOUNTER — Other Ambulatory Visit (HOSPITAL_COMMUNITY): Payer: Self-pay | Admitting: *Deleted

## 2017-02-08 DIAGNOSIS — Z8759 Personal history of other complications of pregnancy, childbirth and the puerperium: Secondary | ICD-10-CM

## 2017-02-09 ENCOUNTER — Other Ambulatory Visit: Payer: Self-pay | Admitting: Obstetrics & Gynecology

## 2017-02-12 ENCOUNTER — Ambulatory Visit (HOSPITAL_COMMUNITY)
Admission: RE | Admit: 2017-02-12 | Discharge: 2017-02-12 | Disposition: A | Discharge status: Home / Self Care | Payer: Self-pay | Source: Ambulatory Visit | Attending: Obstetrics & Gynecology | Admitting: Obstetrics & Gynecology

## 2017-02-12 ENCOUNTER — Encounter (HOSPITAL_COMMUNITY): Payer: Self-pay

## 2017-02-12 DIAGNOSIS — O24410 Gestational diabetes mellitus in pregnancy, diet controlled: Secondary | ICD-10-CM | POA: Insufficient documentation

## 2017-02-12 DIAGNOSIS — O99213 Obesity complicating pregnancy, third trimester: Secondary | ICD-10-CM | POA: Insufficient documentation

## 2017-02-12 DIAGNOSIS — O09293 Supervision of pregnancy with other poor reproductive or obstetric history, third trimester: Secondary | ICD-10-CM | POA: Insufficient documentation

## 2017-02-12 DIAGNOSIS — O0993 Supervision of high risk pregnancy, unspecified, third trimester: Secondary | ICD-10-CM

## 2017-02-12 DIAGNOSIS — Z8759 Personal history of other complications of pregnancy, childbirth and the puerperium: Secondary | ICD-10-CM

## 2017-02-12 DIAGNOSIS — O34211 Maternal care for low transverse scar from previous cesarean delivery: Secondary | ICD-10-CM | POA: Insufficient documentation

## 2017-02-12 DIAGNOSIS — Z3A36 36 weeks gestation of pregnancy: Secondary | ICD-10-CM | POA: Insufficient documentation

## 2017-02-12 DIAGNOSIS — O09213 Supervision of pregnancy with history of pre-term labor, third trimester: Secondary | ICD-10-CM | POA: Insufficient documentation

## 2017-02-17 ENCOUNTER — Encounter (HOSPITAL_COMMUNITY): Payer: Self-pay

## 2017-02-17 NOTE — Pre-Procedure Instructions (Signed)
Interpreter number [phone]

## 2017-02-18 ENCOUNTER — Ambulatory Visit (INDEPENDENT_AMBULATORY_CARE_PROVIDER_SITE_OTHER): Payer: Self-pay | Admitting: Family Medicine

## 2017-02-18 ENCOUNTER — Encounter: Payer: Self-pay | Admitting: Family Medicine

## 2017-02-18 ENCOUNTER — Encounter (HOSPITAL_COMMUNITY): Payer: Self-pay

## 2017-02-18 ENCOUNTER — Other Ambulatory Visit (HOSPITAL_COMMUNITY): Payer: Self-pay | Admitting: Maternal and Fetal Medicine

## 2017-02-18 ENCOUNTER — Ambulatory Visit (HOSPITAL_COMMUNITY)
Admission: RE | Admit: 2017-02-18 | Discharge: 2017-02-18 | Disposition: A | Discharge status: Home / Self Care | Payer: Self-pay | Source: Ambulatory Visit | Attending: Family Medicine | Admitting: Family Medicine

## 2017-02-18 VITALS — BP 108/67 | HR 78 | Wt 186.3 lb

## 2017-02-18 DIAGNOSIS — Z113 Encounter for screening for infections with a predominantly sexual mode of transmission: Secondary | ICD-10-CM

## 2017-02-18 DIAGNOSIS — Z8759 Personal history of other complications of pregnancy, childbirth and the puerperium: Secondary | ICD-10-CM

## 2017-02-18 DIAGNOSIS — Z3A37 37 weeks gestation of pregnancy: Secondary | ICD-10-CM | POA: Insufficient documentation

## 2017-02-18 DIAGNOSIS — O24410 Gestational diabetes mellitus in pregnancy, diet controlled: Secondary | ICD-10-CM | POA: Insufficient documentation

## 2017-02-18 DIAGNOSIS — O09299 Supervision of pregnancy with other poor reproductive or obstetric history, unspecified trimester: Secondary | ICD-10-CM

## 2017-02-18 DIAGNOSIS — Z98891 History of uterine scar from previous surgery: Secondary | ICD-10-CM

## 2017-02-18 DIAGNOSIS — O09219 Supervision of pregnancy with history of pre-term labor, unspecified trimester: Secondary | ICD-10-CM | POA: Insufficient documentation

## 2017-02-18 DIAGNOSIS — O0993 Supervision of high risk pregnancy, unspecified, third trimester: Secondary | ICD-10-CM

## 2017-02-18 DIAGNOSIS — Z6833 Body mass index (BMI) 33.0-33.9, adult: Secondary | ICD-10-CM | POA: Insufficient documentation

## 2017-02-18 DIAGNOSIS — Z789 Other specified health status: Secondary | ICD-10-CM

## 2017-02-18 DIAGNOSIS — O09293 Supervision of pregnancy with other poor reproductive or obstetric history, third trimester: Secondary | ICD-10-CM

## 2017-02-18 DIAGNOSIS — O2620 Pregnancy care for patient with recurrent pregnancy loss, unspecified trimester: Secondary | ICD-10-CM | POA: Insufficient documentation

## 2017-02-18 DIAGNOSIS — O99212 Obesity complicating pregnancy, second trimester: Secondary | ICD-10-CM | POA: Insufficient documentation

## 2017-02-18 DIAGNOSIS — O34219 Maternal care for unspecified type scar from previous cesarean delivery: Secondary | ICD-10-CM | POA: Insufficient documentation

## 2017-02-18 LAB — POCT URINALYSIS DIP (DEVICE)
BILIRUBIN URINE: NEGATIVE
GLUCOSE, UA: NEGATIVE mg/dL
Hgb urine dipstick: NEGATIVE
KETONES UR: NEGATIVE mg/dL
LEUKOCYTES UA: NEGATIVE
NITRITE: NEGATIVE
PH: 6 (ref 5.0–8.0)
Protein, ur: NEGATIVE mg/dL
Specific Gravity, Urine: 1.025 (ref 1.005–1.030)
Urobilinogen, UA: 0.2 mg/dL (ref 0.0–1.0)

## 2017-02-18 NOTE — Progress Notes (Signed)
Subjective:  Julie Allen is a 26 y.o. [id number] at 8846w0d being seen today for ongoing prenatal care.  She is currently monitored for the following issues for this high-risk pregnancy and has Prior pregnancy with fetal demise; History of severe pre-eclampsia; History of gestational diabetes mellitus (GDM); Pregnancy, supervision, high-risk; Obesity in pregnancy; BMI 34.0-34.9,adult; History of cesarean delivery x 2; Language barrier; Transaminitis; and DM (diabetes mellitus), gestational on her problem list.  GDM: Patient diet controlled.  Fasting: controlled 2hr PP: isolated 2-3 elevated, mostly when she eats out.  Patient reports no complaints.  Contractions: Not present. Vag. Bleeding: None.  Movement: Present. Denies leaking of fluid.   The following portions of the patient's history were reviewed and updated as appropriate: allergies, current medications, past family history, past medical history, past social history, past surgical history and problem list. Problem list updated.  Objective:   Vitals:   02/18/17 1514  BP: 108/67  Pulse: 78  Weight: 186 lb 4.8 oz (84.5 kg)    Fetal Status: Fetal Heart Rate (bpm): 150   Movement: Present  Presentation: Vertex  General:  Alert, oriented and cooperative. Patient is in no acute distress.  Skin: Skin is warm and dry. No rash noted.   Cardiovascular: Normal heart rate noted  Respiratory: Normal respiratory effort, no problems with respiration noted  Abdomen: Soft, gravid, appropriate for gestational age. Pain/Pressure: Present     Pelvic: Vag. Bleeding: None     Cervical exam performed Dilation: Closed Effacement (%): Thick Station: Ballotable  Extremities: Normal range of motion.  Edema: None  Mental Status: Normal mood and affect. Normal behavior. Normal judgment and thought content.   Urinalysis:      Assessment and Plan:  Pregnancy: [id number] at 3346w0d  1. Supervision of high risk pregnancy in third trimester FHT and FH -  Cervicovaginal ancillary only - Culture, beta strep (group b only)  2. History of cesarean delivery x 2 Repeat cesarean scheduled for 39 weeks  3. History of severe pre-eclampsia BP controlled  4. Diet controlled gestational diabetes mellitus (GDM) in third trimester Continue diet controlled  5. Prior pregnancy with fetal demise BPP weekly  6. Language barrier Interpreter used   Term labor symptoms and general obstetric precautions including but not limited to vaginal bleeding, contractions, leaking of fluid and fetal movement were reviewed in detail with the patient. Please refer to After Visit Summary for other counseling recommendations.  No Follow-up on file.   Levie HeritageStinson, Alayiah Fontes J, DO

## 2017-02-19 LAB — CERVICOVAGINAL ANCILLARY ONLY
Chlamydia: NEGATIVE
Neisseria Gonorrhea: NEGATIVE

## 2017-02-23 LAB — CULTURE, BETA STREP (GROUP B ONLY): STREP GP B CULTURE: NEGATIVE

## 2017-02-25 ENCOUNTER — Ambulatory Visit (HOSPITAL_COMMUNITY)
Admission: RE | Admit: 2017-02-25 | Discharge: 2017-02-25 | Disposition: A | Discharge status: Home / Self Care | Payer: Medicaid Other | Source: Ambulatory Visit | Attending: Family Medicine | Admitting: Family Medicine

## 2017-02-25 ENCOUNTER — Ambulatory Visit (INDEPENDENT_AMBULATORY_CARE_PROVIDER_SITE_OTHER): Payer: Self-pay | Admitting: Family Medicine

## 2017-02-25 ENCOUNTER — Encounter (HOSPITAL_COMMUNITY): Payer: Self-pay

## 2017-02-25 VITALS — BP 102/70 | HR 79 | Wt 186.7 lb

## 2017-02-25 DIAGNOSIS — O09299 Supervision of pregnancy with other poor reproductive or obstetric history, unspecified trimester: Secondary | ICD-10-CM | POA: Insufficient documentation

## 2017-02-25 DIAGNOSIS — O24410 Gestational diabetes mellitus in pregnancy, diet controlled: Secondary | ICD-10-CM | POA: Insufficient documentation

## 2017-02-25 DIAGNOSIS — O34219 Maternal care for unspecified type scar from previous cesarean delivery: Secondary | ICD-10-CM | POA: Diagnosis not present

## 2017-02-25 DIAGNOSIS — O0993 Supervision of high risk pregnancy, unspecified, third trimester: Secondary | ICD-10-CM

## 2017-02-25 DIAGNOSIS — O09219 Supervision of pregnancy with history of pre-term labor, unspecified trimester: Secondary | ICD-10-CM | POA: Diagnosis not present

## 2017-02-25 DIAGNOSIS — Z8759 Personal history of other complications of pregnancy, childbirth and the puerperium: Secondary | ICD-10-CM | POA: Diagnosis not present

## 2017-02-25 DIAGNOSIS — O09293 Supervision of pregnancy with other poor reproductive or obstetric history, third trimester: Secondary | ICD-10-CM

## 2017-02-25 DIAGNOSIS — Z3A38 38 weeks gestation of pregnancy: Secondary | ICD-10-CM | POA: Insufficient documentation

## 2017-02-25 DIAGNOSIS — O99213 Obesity complicating pregnancy, third trimester: Secondary | ICD-10-CM | POA: Diagnosis not present

## 2017-02-25 DIAGNOSIS — O2620 Pregnancy care for patient with recurrent pregnancy loss, unspecified trimester: Secondary | ICD-10-CM | POA: Diagnosis not present

## 2017-02-25 DIAGNOSIS — Z6833 Body mass index (BMI) 33.0-33.9, adult: Secondary | ICD-10-CM | POA: Insufficient documentation

## 2017-02-25 LAB — POCT URINALYSIS DIP (DEVICE)
Bilirubin Urine: NEGATIVE
Glucose, UA: NEGATIVE mg/dL
Hgb urine dipstick: NEGATIVE
KETONES UR: NEGATIVE mg/dL
Nitrite: NEGATIVE
PH: 6 (ref 5.0–8.0)
PROTEIN: NEGATIVE mg/dL
UROBILINOGEN UA: 0.2 mg/dL (ref 0.0–1.0)

## 2017-02-25 NOTE — Progress Notes (Signed)
   PRENATAL VISIT NOTE  Subjective:  Julie Allen is a 26 y.o. [id number] at 5972w0d being seen today for ongoing prenatal care.  She is currently monitored for the following issues for this high-risk pregnancy and has Prior pregnancy with fetal demise; History of severe pre-eclampsia; History of gestational diabetes mellitus (GDM); Pregnancy, supervision, high-risk; Obesity in pregnancy; BMI 34.0-34.9,adult; History of cesarean delivery x 2; Language barrier; Transaminitis; and DM (diabetes mellitus), gestational on her problem list.  Patient reports no complaints.  Contractions: Not present. Vag. Bleeding: None.  Movement: Present. Denies leaking of fluid.   GDM: diet controlled, forgot book today Fasting: 80-90 2hr PP: 98-113  The following portions of the patient's history were reviewed and updated as appropriate: allergies, current medications, past family history, past medical history, past social history, past surgical history and problem list. Problem list updated.  Objective:   Vitals:   02/25/17 1514  BP: 102/70  Pulse: 79  Weight: 186 lb 11.2 oz (84.7 kg)    Fetal Status: Fetal Heart Rate (bpm): 138   Movement: Present     General:  Alert, oriented and cooperative. Patient is in no acute distress.  Skin: Skin is warm and dry. No rash noted.   Cardiovascular: Normal heart rate noted  Respiratory: Normal respiratory effort, no problems with respiration noted  Abdomen: Soft, gravid, appropriate for gestational age.  Pain/Pressure: Present     Pelvic: Cervical exam deferred        Extremities: Normal range of motion.  Edema: None  Mental Status:  Normal mood and affect. Normal behavior. Normal judgment and thought content.   Assessment and Plan:  Pregnancy: [id number] at 10872w0d  1. Prior pregnancy with fetal demise BPP weekly (8/8 today)  2. History of severe pre-eclampsia Continue weekly monitoring  3. Supervision of high risk pregnancy in third trimester LTCS  scheduled for next week  4. Diet controlled gestational diabetes mellitus (GDM) in third trimester Continue diet controlled  5. Language barrier Interpreter used  Term labor symptoms and general obstetric precautions including but not limited to vaginal bleeding, contractions, leaking of fluid and fetal movement were reviewed in detail with the patient. Please refer to After Visit Summary for other counseling recommendations.  No Follow-up on file.   Loni MuseKate Nakaiya Beddow, MD

## 2017-03-01 MED ORDER — PROPOFOL 10 MG/ML IV BOLUS
INTRAVENOUS | Status: AC
  Filled 2017-03-01: qty 20

## 2017-03-03 ENCOUNTER — Encounter (HOSPITAL_COMMUNITY)
Admission: RE | Admit: 2017-03-03 | Discharge: 2017-03-03 | Disposition: A | Discharge status: Home / Self Care | Payer: Medicaid Other | Source: Ambulatory Visit | Attending: Obstetrics & Gynecology | Admitting: Obstetrics & Gynecology

## 2017-03-03 ENCOUNTER — Ambulatory Visit: Payer: Self-pay | Admitting: Obstetrics and Gynecology

## 2017-03-03 VITALS — BP 111/67 | HR 89 | Wt 187.1 lb

## 2017-03-03 DIAGNOSIS — O24410 Gestational diabetes mellitus in pregnancy, diet controlled: Secondary | ICD-10-CM

## 2017-03-03 DIAGNOSIS — R740 Nonspecific elevation of levels of transaminase and lactic acid dehydrogenase [LDH]: Secondary | ICD-10-CM

## 2017-03-03 DIAGNOSIS — O09299 Supervision of pregnancy with other poor reproductive or obstetric history, unspecified trimester: Secondary | ICD-10-CM

## 2017-03-03 DIAGNOSIS — R7401 Elevation of levels of liver transaminase levels: Secondary | ICD-10-CM

## 2017-03-03 DIAGNOSIS — O0993 Supervision of high risk pregnancy, unspecified, third trimester: Secondary | ICD-10-CM

## 2017-03-03 DIAGNOSIS — Z98891 History of uterine scar from previous surgery: Secondary | ICD-10-CM

## 2017-03-03 DIAGNOSIS — Z789 Other specified health status: Secondary | ICD-10-CM

## 2017-03-03 DIAGNOSIS — O99210 Obesity complicating pregnancy, unspecified trimester: Secondary | ICD-10-CM

## 2017-03-03 LAB — TYPE AND SCREEN
ABO/RH(D): O POS
ANTIBODY SCREEN: NEGATIVE

## 2017-03-03 LAB — CBC
HCT: 34.1 % — ABNORMAL LOW (ref 36.0–46.0)
Hemoglobin: 10.7 g/dL — ABNORMAL LOW (ref 12.0–15.0)
MCH: 23.7 pg — ABNORMAL LOW (ref 26.0–34.0)
MCHC: 31.4 g/dL (ref 30.0–36.0)
MCV: 75.4 fL — ABNORMAL LOW (ref 78.0–100.0)
PLATELETS: 266 10*3/uL (ref 150–400)
RBC: 4.52 MIL/uL (ref 3.87–5.11)
RDW: 15.3 % (ref 11.5–15.5)
WBC: 5.6 10*3/uL (ref 4.0–10.5)

## 2017-03-03 MED ORDER — LIDOCAINE HCL 1 % IJ SOLN
INTRAMUSCULAR | Status: AC
  Filled 2017-03-03: qty 20

## 2017-03-03 NOTE — Patient Instructions (Signed)
Instrucciones Para Antes de la Ciruga   Su ciruga est programada para 03/04/2017  (your procedure is scheduled on) Entre por la entrada principal del [facility]  a las [address] de la [place] -(enter through the main entrance at [facility] at  [facility]    Levante el telfono, [place]que el [phone] para informarnos de su llegada. (pick up phone, dial [phone] on arrival)     Por favor llame al [phone] si tiene algn problema en la maana de la ciruga (please call  if you have any problems the morning of surgery.)                  Recuerde: (Remember)  No coma alimentos despues a la media. (Do not eat food )    No tome lquidos claros despues a la media. (Do not drink clear liquids )    No use joyas, maquillaje de ojos, lpiz labial, crema para el cuerpo o esmalte de uas oscuro. (Do not wear jewelry, eye makeup, lipstick, body lotion, or dark fingernail polish). Puede usar desodorante (you may wear deodorant)    No se afeite 48 horas de su ciruga. (Do not shave 48 hours before your surgery)    No traiga objetos de valor al hospital.  [facility] no se hace responsable de ninguna pertenencia, ni objetos de valor que haya trado al hospital. (Do not bring valuable to the hospital.  [facility] is not responsible for any belongings or valuables brought to the hospital)   [facility] medicinas en la maana de la ciruga con un SORBITO de agua nada (take these meds the morning of surgery with a SIP of water)     Durante la ciruga no se pueden usar lentes de contacto, dentaduras o puentes. (Contacts, dentures or bridgework cannot be worn in surgery).   Si va a ser ingresado despus de la ciruga, deje la AMR Corporationmaleta en el carro hasta que se le haya asignado una habitacin. (If you are to be admitted after surgery, leave suitcase in car until your room has been assigned.)   A los pacientes que se les d de alta el mismo da no se les permitir manejar a casa.   (Patients discharged on the day of surgery will not be allowed to drive home)    [place] y nmero de telfono del Engineer, productionconductor nada. (Name and telephone number of your driver)   Instrucciones especiales N/A (Special Instructions)   Por favor, lea las hojas informativas que le entregaron. (Please read over the following fact sheets that you were given) Surgical Site Infection Prevention

## 2017-03-03 NOTE — Progress Notes (Signed)
Prenatal Visit Note Date: 03/03/2017 Clinic: [facility]  Subjective:  Julie Allen is a 26 y.o. [id number] at 5970w6d being seen today for ongoing prenatal care.  She is currently monitored for the following issues for this high-risk pregnancy and has Prior pregnancy with fetal demise; History of severe pre-eclampsia; History of gestational diabetes mellitus (GDM); Pregnancy, supervision, high-risk; Obesity in pregnancy; BMI 34.0-34.9,adult; History of cesarean delivery x 2; Language barrier; Transaminitis; and DM (diabetes mellitus), gestational on her problem list.  Patient reports no complaints.   Contractions: Not present.  .  Movement: Present. Denies leaking of fluid.   The following portions of the patient's history were reviewed and updated as appropriate: allergies, current medications, past family history, past medical history, past social history, past surgical history and problem list. Problem list updated.  Objective:   Vitals:   03/03/17 1051  BP: 111/67  Pulse: 89  Weight: 187 lb 1.6 oz (84.9 kg)    Fetal Status: Fetal Heart Rate (bpm): 150   Movement: Present     General:  Alert, oriented and cooperative. Patient is in no acute distress.  Skin: Skin is warm and dry. No rash noted.   Cardiovascular: Normal heart rate noted  Respiratory: Normal respiratory effort, no problems with respiration noted  Abdomen: Soft, gravid, appropriate for gestational age. Pain/Pressure: Present     Pelvic:  Cervical exam deferred        Extremities: Normal range of motion.  Edema: Trace  Mental Status: Normal mood and affect. Normal behavior. Normal judgment and thought content.   Urinalysis:      Assessment and Plan:  Pregnancy: [id number] at 3770w6d  1. Diet controlled gestational diabetes mellitus (GDM) in third trimester Normal BS log today.   2. Supervision of high risk pregnancy in third trimester nexplanon  3. History of cesarean delivery x 2 Scheduled  for tomorrow  4. Obesity in pregnancy   5. Transaminitis Rpt PP  6. Prior pregnancy with fetal demise 8/8 BPP on 8/2  7. Language barrier Interpreter used  Term labor symptoms and general obstetric precautions including but not limited to vaginal bleeding, contractions, leaking of fluid and fetal movement were reviewed in detail with the patient. Please refer to After Visit Summary for other counseling recommendations.  Return in about 4 weeks (around 03/31/2017) for PP visit.    BingPickens, Allee Busk, MD

## 2017-03-04 ENCOUNTER — Inpatient Hospital Stay (HOSPITAL_COMMUNITY): Payer: Medicaid Other | Admitting: Certified Registered Nurse Anesthetist

## 2017-03-04 ENCOUNTER — Inpatient Hospital Stay (HOSPITAL_COMMUNITY)
Admission: RE | Admit: 2017-03-04 | Discharge: 2017-03-06 | DRG: 766 | Disposition: A | Discharge status: Home / Self Care | Payer: Medicaid Other | Source: Ambulatory Visit | Attending: Obstetrics & Gynecology | Admitting: Obstetrics & Gynecology

## 2017-03-04 ENCOUNTER — Encounter (HOSPITAL_COMMUNITY)
Admission: RE | Disposition: A | Discharge status: Home / Self Care | Payer: Self-pay | Source: Ambulatory Visit | Attending: Obstetrics & Gynecology

## 2017-03-04 ENCOUNTER — Encounter (HOSPITAL_COMMUNITY): Payer: Self-pay | Admitting: *Deleted

## 2017-03-04 DIAGNOSIS — O9902 Anemia complicating childbirth: Secondary | ICD-10-CM | POA: Diagnosis present

## 2017-03-04 DIAGNOSIS — E669 Obesity, unspecified: Secondary | ICD-10-CM | POA: Diagnosis present

## 2017-03-04 DIAGNOSIS — D649 Anemia, unspecified: Secondary | ICD-10-CM | POA: Diagnosis present

## 2017-03-04 DIAGNOSIS — Z7982 Long term (current) use of aspirin: Secondary | ICD-10-CM

## 2017-03-04 DIAGNOSIS — O34211 Maternal care for low transverse scar from previous cesarean delivery: Secondary | ICD-10-CM | POA: Diagnosis present

## 2017-03-04 DIAGNOSIS — O24420 Gestational diabetes mellitus in childbirth, diet controlled: Secondary | ICD-10-CM | POA: Diagnosis present

## 2017-03-04 DIAGNOSIS — Z3A39 39 weeks gestation of pregnancy: Secondary | ICD-10-CM

## 2017-03-04 DIAGNOSIS — O09299 Supervision of pregnancy with other poor reproductive or obstetric history, unspecified trimester: Secondary | ICD-10-CM

## 2017-03-04 DIAGNOSIS — O99210 Obesity complicating pregnancy, unspecified trimester: Secondary | ICD-10-CM

## 2017-03-04 DIAGNOSIS — Z8759 Personal history of other complications of pregnancy, childbirth and the puerperium: Secondary | ICD-10-CM

## 2017-03-04 DIAGNOSIS — Z789 Other specified health status: Secondary | ICD-10-CM

## 2017-03-04 DIAGNOSIS — R7401 Elevation of levels of liver transaminase levels: Secondary | ICD-10-CM

## 2017-03-04 DIAGNOSIS — Z8632 Personal history of gestational diabetes: Secondary | ICD-10-CM

## 2017-03-04 DIAGNOSIS — O24419 Gestational diabetes mellitus in pregnancy, unspecified control: Secondary | ICD-10-CM | POA: Diagnosis present

## 2017-03-04 DIAGNOSIS — O24410 Gestational diabetes mellitus in pregnancy, diet controlled: Secondary | ICD-10-CM

## 2017-03-04 DIAGNOSIS — Z98891 History of uterine scar from previous surgery: Secondary | ICD-10-CM

## 2017-03-04 DIAGNOSIS — O99214 Obesity complicating childbirth: Secondary | ICD-10-CM | POA: Diagnosis present

## 2017-03-04 DIAGNOSIS — Z6835 Body mass index (BMI) 35.0-35.9, adult: Secondary | ICD-10-CM | POA: Diagnosis not present

## 2017-03-04 DIAGNOSIS — Z603 Acculturation difficulty: Secondary | ICD-10-CM

## 2017-03-04 DIAGNOSIS — Z6834 Body mass index (BMI) 34.0-34.9, adult: Secondary | ICD-10-CM

## 2017-03-04 DIAGNOSIS — O0993 Supervision of high risk pregnancy, unspecified, third trimester: Secondary | ICD-10-CM

## 2017-03-04 DIAGNOSIS — R740 Nonspecific elevation of levels of transaminase and lactic acid dehydrogenase [LDH]: Secondary | ICD-10-CM

## 2017-03-04 LAB — GLUCOSE, CAPILLARY
Glucose-Capillary: 91 mg/dL (ref 65–99)
Glucose-Capillary: 62 mg/dL — ABNORMAL LOW (ref 65–99)
Glucose-Capillary: 78 mg/dL (ref 65–99)

## 2017-03-04 LAB — RPR: RPR: NONREACTIVE

## 2017-03-04 SURGERY — Surgical Case
Anesthesia: Spinal

## 2017-03-04 MED ORDER — SIMETHICONE 80 MG PO CHEW
80.0000 mg | CHEWABLE_TABLET | Freq: Three times a day (TID) | ORAL | Status: DC
  Administered 2017-03-04 – 2017-03-06 (×5): 80 mg via ORAL
  Filled 2017-03-04 (×5): qty 1

## 2017-03-04 MED ORDER — LIDOCAINE HCL (CARDIAC) 20 MG/ML IV SOLN
INTRAVENOUS | Status: AC
  Filled 2017-03-04: qty 5

## 2017-03-04 MED ORDER — MEPERIDINE HCL 25 MG/ML IJ SOLN: 6.2500 mg | INTRAMUSCULAR | Status: DC | PRN

## 2017-03-04 MED ORDER — ONDANSETRON HCL 4 MG/2ML IJ SOLN: 4.0000 mg | Freq: Three times a day (TID) | INTRAMUSCULAR | Status: DC | PRN

## 2017-03-04 MED ORDER — DIPHENHYDRAMINE HCL 25 MG PO CAPS: 25.0000 mg | ORAL_CAPSULE | Freq: Four times a day (QID) | ORAL | Status: DC | PRN

## 2017-03-04 MED ORDER — SIMETHICONE 80 MG PO CHEW: 80.0000 mg | CHEWABLE_TABLET | ORAL | Status: DC | PRN

## 2017-03-04 MED ORDER — FENTANYL CITRATE (PF) 100 MCG/2ML IJ SOLN
INTRAMUSCULAR | Status: AC
  Filled 2017-03-04: qty 2

## 2017-03-04 MED ORDER — MENTHOL 3 MG MT LOZG: 1.0000 | LOZENGE | OROMUCOSAL | Status: DC | PRN

## 2017-03-04 MED ORDER — FENTANYL CITRATE (PF) 100 MCG/2ML IJ SOLN: 25.0000 ug | INTRAMUSCULAR | Status: DC | PRN

## 2017-03-04 MED ORDER — BUPIVACAINE IN DEXTROSE 0.75-8.25 % IT SOLN
INTRATHECAL | Status: DC | PRN
  Administered 2017-03-04: 1.6 mL via INTRATHECAL

## 2017-03-04 MED ORDER — KETOROLAC TROMETHAMINE 30 MG/ML IJ SOLN: 30.0000 mg | Freq: Four times a day (QID) | INTRAMUSCULAR | Status: AC | PRN

## 2017-03-04 MED ORDER — BUPIVACAINE HCL (PF) 0.5 % IJ SOLN
INTRAMUSCULAR | Status: AC
  Filled 2017-03-04: qty 30

## 2017-03-04 MED ORDER — PHENYLEPHRINE HCL 10 MG/ML IJ SOLN
INTRAMUSCULAR | Status: DC | PRN
  Administered 2017-03-04: 40 ug via INTRAVENOUS

## 2017-03-04 MED ORDER — PHENYLEPHRINE 8 MG IN D5W 100 ML (0.08MG/ML) PREMIX OPTIME
INJECTION | INTRAVENOUS | Status: AC
  Filled 2017-03-04: qty 100

## 2017-03-04 MED ORDER — OXYCODONE HCL 5 MG PO TABS: 10.0000 mg | ORAL_TABLET | ORAL | Status: DC | PRN

## 2017-03-04 MED ORDER — SODIUM CHLORIDE 0.9% FLUSH: 3.0000 mL | INTRAVENOUS | Status: DC | PRN

## 2017-03-04 MED ORDER — BUPIVACAINE HCL (PF) 0.5 % IJ SOLN
INTRAMUSCULAR | Status: DC | PRN
  Administered 2017-03-04: 30 mL

## 2017-03-04 MED ORDER — LACTATED RINGERS IV SOLN
INTRAVENOUS | Status: DC
  Administered 2017-03-04 (×2): via INTRAVENOUS

## 2017-03-04 MED ORDER — LACTATED RINGERS IV SOLN
INTRAVENOUS | Status: DC
  Administered 2017-03-04 (×2): via INTRAVENOUS

## 2017-03-04 MED ORDER — ZOLPIDEM TARTRATE 5 MG PO TABS: 5.0000 mg | ORAL_TABLET | Freq: Every evening | ORAL | Status: DC | PRN

## 2017-03-04 MED ORDER — COCONUT OIL OIL: 1.0000 "application " | TOPICAL_OIL | Status: DC | PRN

## 2017-03-04 MED ORDER — NALBUPHINE HCL 10 MG/ML IJ SOLN: 5.0000 mg | INTRAMUSCULAR | Status: DC | PRN

## 2017-03-04 MED ORDER — PROMETHAZINE HCL 25 MG/ML IJ SOLN: 6.2500 mg | INTRAMUSCULAR | Status: DC | PRN

## 2017-03-04 MED ORDER — PRENATAL MULTIVITAMIN CH
1.0000 | ORAL_TABLET | Freq: Every day | ORAL | Status: DC
  Administered 2017-03-05 – 2017-03-06 (×2): 1 via ORAL
  Filled 2017-03-04 (×2): qty 1

## 2017-03-04 MED ORDER — ACETAMINOPHEN 500 MG PO TABS
1000.0000 mg | ORAL_TABLET | Freq: Four times a day (QID) | ORAL | Status: AC
  Administered 2017-03-04 – 2017-03-05 (×3): 1000 mg via ORAL
  Filled 2017-03-04 (×3): qty 2

## 2017-03-04 MED ORDER — NALOXONE HCL 0.4 MG/ML IJ SOLN: 0.4000 mg | INTRAMUSCULAR | Status: DC | PRN

## 2017-03-04 MED ORDER — OXYTOCIN 10 UNIT/ML IJ SOLN
INTRAMUSCULAR | Status: AC
  Filled 2017-03-04: qty 4

## 2017-03-04 MED ORDER — NALBUPHINE HCL 10 MG/ML IJ SOLN
5.0000 mg | INTRAMUSCULAR | Status: DC | PRN
Start: 2017-03-04 — End: 2017-03-06

## 2017-03-04 MED ORDER — BUPIVACAINE IN DEXTROSE 0.75-8.25 % IT SOLN
INTRATHECAL | Status: AC
  Filled 2017-03-04: qty 2

## 2017-03-04 MED ORDER — DIBUCAINE 1 % RE OINT: 1.0000 "application " | TOPICAL_OINTMENT | RECTAL | Status: DC | PRN

## 2017-03-04 MED ORDER — SCOPOLAMINE 1 MG/3DAYS TD PT72
1.0000 | MEDICATED_PATCH | Freq: Once | TRANSDERMAL | Status: DC
  Administered 2017-03-04: 1.5 mg via TRANSDERMAL
  Filled 2017-03-04: qty 1

## 2017-03-04 MED ORDER — FENTANYL CITRATE (PF) 100 MCG/2ML IJ SOLN
INTRAMUSCULAR | Status: DC | PRN
  Administered 2017-03-04: 10 ug via INTRATHECAL

## 2017-03-04 MED ORDER — MORPHINE SULFATE (PF) 0.5 MG/ML IJ SOLN
INTRAMUSCULAR | Status: DC | PRN
  Administered 2017-03-04: .2 mg via INTRATHECAL

## 2017-03-04 MED ORDER — SODIUM BICARBONATE 8.4 % IV SOLN
INTRAVENOUS | Status: AC
  Filled 2017-03-04: qty 50

## 2017-03-04 MED ORDER — LACTATED RINGERS IV SOLN
INTRAVENOUS | Status: DC | PRN
  Administered 2017-03-04: 10:00:00 via INTRAVENOUS

## 2017-03-04 MED ORDER — NALOXONE HCL 2 MG/2ML IJ SOSY
1.0000 ug/kg/h | PREFILLED_SYRINGE | INTRAVENOUS | Status: DC | PRN
  Filled 2017-03-04: qty 2

## 2017-03-04 MED ORDER — NALBUPHINE HCL 10 MG/ML IJ SOLN: 5.0000 mg | Freq: Once | INTRAMUSCULAR | Status: DC | PRN

## 2017-03-04 MED ORDER — SENNOSIDES-DOCUSATE SODIUM 8.6-50 MG PO TABS
2.0000 | ORAL_TABLET | ORAL | Status: DC
  Administered 2017-03-04 – 2017-03-05 (×2): 2 via ORAL
  Filled 2017-03-04 (×2): qty 2

## 2017-03-04 MED ORDER — OXYTOCIN 40 UNITS IN LACTATED RINGERS INFUSION - SIMPLE MED: 2.5000 [IU]/h | INTRAVENOUS | Status: AC

## 2017-03-04 MED ORDER — SOD CITRATE-CITRIC ACID 500-334 MG/5ML PO SOLN
30.0000 mL | Freq: Once | ORAL | Status: AC
  Administered 2017-03-04: 30 mL via ORAL
  Filled 2017-03-04: qty 15

## 2017-03-04 MED ORDER — PHENYLEPHRINE 8 MG IN D5W 100 ML (0.08MG/ML) PREMIX OPTIME
INJECTION | INTRAVENOUS | Status: DC | PRN
  Administered 2017-03-04: 60 ug/min via INTRAVENOUS

## 2017-03-04 MED ORDER — FAMOTIDINE 20 MG PO TABS
20.0000 mg | ORAL_TABLET | Freq: Once | ORAL | Status: AC
  Administered 2017-03-04: 20 mg via ORAL
  Filled 2017-03-04: qty 1

## 2017-03-04 MED ORDER — TETANUS-DIPHTH-ACELL PERTUSSIS 5-2.5-18.5 LF-MCG/0.5 IM SUSP: 0.5000 mL | Freq: Once | INTRAMUSCULAR | Status: DC

## 2017-03-04 MED ORDER — SIMETHICONE 80 MG PO CHEW
80.0000 mg | CHEWABLE_TABLET | ORAL | Status: DC
  Administered 2017-03-04 – 2017-03-05 (×2): 80 mg via ORAL
  Filled 2017-03-04 (×2): qty 1

## 2017-03-04 MED ORDER — PROPOFOL 10 MG/ML IV BOLUS
INTRAVENOUS | Status: AC
  Filled 2017-03-04: qty 20

## 2017-03-04 MED ORDER — CEFAZOLIN SODIUM-DEXTROSE 2-4 GM/100ML-% IV SOLN
2.0000 g | INTRAVENOUS | Status: AC
  Administered 2017-03-04: 2 g via INTRAVENOUS
  Filled 2017-03-04: qty 100

## 2017-03-04 MED ORDER — WITCH HAZEL-GLYCERIN EX PADS: 1.0000 "application " | MEDICATED_PAD | CUTANEOUS | Status: DC | PRN

## 2017-03-04 MED ORDER — ONDANSETRON HCL 4 MG/2ML IJ SOLN
INTRAMUSCULAR | Status: AC
  Filled 2017-03-04: qty 2

## 2017-03-04 MED ORDER — DIPHENHYDRAMINE HCL 50 MG/ML IJ SOLN
12.5000 mg | INTRAMUSCULAR | Status: DC | PRN
Start: 2017-03-04 — End: 2017-03-06

## 2017-03-04 MED ORDER — ONDANSETRON HCL 4 MG/2ML IJ SOLN
INTRAMUSCULAR | Status: DC | PRN
  Administered 2017-03-04: 4 mg via INTRAVENOUS

## 2017-03-04 MED ORDER — OXYCODONE HCL 5 MG PO TABS: 5.0000 mg | ORAL_TABLET | ORAL | Status: DC | PRN

## 2017-03-04 MED ORDER — MORPHINE SULFATE (PF) 0.5 MG/ML IJ SOLN
INTRAMUSCULAR | Status: AC
  Filled 2017-03-04: qty 10

## 2017-03-04 MED ORDER — VITAMIN K1 1 MG/0.5ML IJ SOLN
INTRAMUSCULAR | Status: DC
Start: 2017-03-04 — End: 2017-03-04
  Filled 2017-03-04: qty 0.5

## 2017-03-04 MED ORDER — IBUPROFEN 600 MG PO TABS
600.0000 mg | ORAL_TABLET | Freq: Four times a day (QID) | ORAL | Status: DC
  Administered 2017-03-04 – 2017-03-06 (×8): 600 mg via ORAL
  Filled 2017-03-04 (×8): qty 1

## 2017-03-04 MED ORDER — OXYTOCIN 10 UNIT/ML IJ SOLN
INTRAVENOUS | Status: DC | PRN
  Administered 2017-03-04: 40 [IU] via INTRAVENOUS

## 2017-03-04 MED ORDER — ACETAMINOPHEN 325 MG PO TABS: 650.0000 mg | ORAL_TABLET | ORAL | Status: DC | PRN

## 2017-03-04 MED ORDER — DIPHENHYDRAMINE HCL 25 MG PO CAPS: 25.0000 mg | ORAL_CAPSULE | ORAL | Status: DC | PRN

## 2017-03-04 SURGICAL SUPPLY — 37 items
BARRIER ADHS 3X4 INTERCEED (GAUZE/BANDAGES/DRESSINGS) IMPLANT
BENZOIN TINCTURE PRP APPL 2/3 (GAUZE/BANDAGES/DRESSINGS) ×3 IMPLANT
CHLORAPREP W/TINT 26ML (MISCELLANEOUS) ×3 IMPLANT
CLAMP CORD UMBIL (MISCELLANEOUS) IMPLANT
CLOSURE STERI STRIP 1/2 X4 (GAUZE/BANDAGES/DRESSINGS) ×3 IMPLANT
CLOTH BEACON ORANGE TIMEOUT ST (SAFETY) ×3 IMPLANT
DRSG OPSITE POSTOP 4X10 (GAUZE/BANDAGES/DRESSINGS) ×3 IMPLANT
ELECT REM PT RETURN 9FT ADLT (ELECTROSURGICAL) ×3
ELECTRODE REM PT RTRN 9FT ADLT (ELECTROSURGICAL) ×1 IMPLANT
EXTRACTOR VACUUM KIWI (MISCELLANEOUS) IMPLANT
GAUZE SPONGE 4X4 12PLY STRL LF (GAUZE/BANDAGES/DRESSINGS) ×3 IMPLANT
GLOVE BIO SURGEON STRL SZ 6.5 (GLOVE) ×2 IMPLANT
GLOVE BIO SURGEONS STRL SZ 6.5 (GLOVE) ×1
GLOVE BIOGEL PI IND STRL 7.0 (GLOVE) ×2 IMPLANT
GLOVE BIOGEL PI INDICATOR 7.0 (GLOVE) ×4
GOWN STRL REUS W/TWL LRG LVL3 (GOWN DISPOSABLE) ×6 IMPLANT
KIT ABG SYR 3ML LUER SLIP (SYRINGE) IMPLANT
NEEDLE HYPO 22GX1.5 SAFETY (NEEDLE) IMPLANT
NEEDLE HYPO 25X5/8 SAFETYGLIDE (NEEDLE) IMPLANT
NS IRRIG 1000ML POUR BTL (IV SOLUTION) ×3 IMPLANT
PACK C SECTION WH (CUSTOM PROCEDURE TRAY) ×3 IMPLANT
PAD ABD 7.5X8 STRL (GAUZE/BANDAGES/DRESSINGS) ×3 IMPLANT
PAD ABD DERMACEA PRESS 5X9 (GAUZE/BANDAGES/DRESSINGS) ×3 IMPLANT
PAD OB MATERNITY 4.3X12.25 (PERSONAL CARE ITEMS) ×3 IMPLANT
PENCIL SMOKE EVAC W/HOLSTER (ELECTROSURGICAL) ×3 IMPLANT
RETRACTOR WND ALEXIS 25 LRG (MISCELLANEOUS) IMPLANT
RTRCTR WOUND ALEXIS 25CM LRG (MISCELLANEOUS)
SPONGE LAP 18X18 X RAY DECT (DISPOSABLE) ×3 IMPLANT
SUT PLAIN 2 0 XLH (SUTURE) ×3 IMPLANT
SUT VIC AB 0 CT1 36 (SUTURE) ×18 IMPLANT
SUT VIC AB 2-0 CT1 27 (SUTURE) ×2
SUT VIC AB 2-0 CT1 TAPERPNT 27 (SUTURE) ×1 IMPLANT
SUT VIC AB 4-0 KS 27 (SUTURE) ×3 IMPLANT
SUT VIC AB 4-0 PS2 27 (SUTURE) ×3 IMPLANT
SYR CONTROL 10ML LL (SYRINGE) IMPLANT
TOWEL OR 17X24 6PK STRL BLUE (TOWEL DISPOSABLE) ×3 IMPLANT
TRAY FOLEY BAG SILVER LF 14FR (SET/KITS/TRAYS/PACK) IMPLANT

## 2017-03-04 NOTE — Op Note (Signed)
Cesarean Section Operative Report  Lavra Abarca-Tapia  PROCEDURE DATE: 03/04/2017  PREOPERATIVE DIAGNOSES: Intrauterine pregnancy at [redacted]w[redacted]d weeks gestation; Previous Cesarean Section x 2.   POSTOPERATIVE DIAGNOSES: The same  PROCEDURRepeat Low Transverse Cesarean Section  SURGEON:   Surgeon(s) and Role:    * Adam Phenix, MD - Primary - Attending    * Nolene Ebbs, MD - OB Fellow  ASSISTANT:  Nolene Ebbs, MD - OB Fellow   INDICATIONS: Julie Allen is a 26 y.o. [phone] at [redacted]w[redacted]d here for cesarean section secondary to the indications listed under preoperative diagnoses; please see preoperative note for further details.  The risks of cesarean section were discussed with the patient including but were not limited to: bleeding which may require transfusion or reoperation; infection which may require antibiotics; injury to bowel, bladder, ureters or other surrounding organs; injury to the fetus; need for additional procedures including hysterectomy in the event of a life-threatening hemorrhage; placental abnormalities wth subsequent pregnancies, incisional problems, thromboembolic phenomenon and other postoperative/anesthesia complications.   The patient concurred with the proposed plan, giving informed written consent for the procedure.    FINDINGS:  Viable female infant in cephalic presentation.  Apgars 9 and 9.  Clear amniotic fluid.  Intact placenta, three vessel cord.  Normal uterus. Mild adhesions  ANESTHESIA: Spinal INTRAVENOUS FLUIDS: 2500 ml ESTIMATED BLOOD LOSS: 730 ml URINE OUTPUT:  700 ml SPECIMENS: Placenta sent to L&D COMPLICATIONS: None immediate  PROCEDURE IN DETAIL:  The patient preoperatively received intravenous antibiotics and had sequential compression devices applied to her lower extremities.  She was then taken to the operating room where spinal anesthesia was administered and was found to be adequate. She was then placed in a dorsal supine position with a  leftward tilt, and prepped and draped in a sterile manner.  A foley catheter was placed into her bladder and attached to constant gravity.    After an adequate timeout was performed, a Pfannenstiel skin incision was made with scalpel and carried through to the underlying layer of fascia. The fascia was incised in the midline, and this incision was extended bilaterally using the Mayo scissors.  Kocher clamps were applied to the superior aspect of the fascial incision and the underlying rectus muscles were dissected off bluntly.  A similar process was carried out on the inferior aspect of the fascial incision. The rectus muscles were separated in the midline bluntly and the peritoneum was entered with small sharp incision with scissor, and extended bluntly. Attention was turned to the lower uterine segment where a low transverse hysterotomy was made with a scalpel and extended bilaterally bluntly. Attempt was made to collect amniotic fluid for donation, but not enough amount of amniotic fluid for collection. The infant was successfully delivered, the cord was clamped and cut after one minute, and the infant was handed over to the awaiting neonatology team. Uterine massage was then administered; umbilical cord avulsed during placenta delivery, and placenta was manually extracted. The uterus was then cleared of clots and debris.  The hysterotomy was closed with 0 Vicryl in a running locked fashion, and an imbricating layer was also placed with 0 Vicryl.  The pelvis was cleared of all clot and debris. Hemostasis was confirmed on all surfaces.  The peritoneum was closed with a 0 Vicryl running stitch. The fascia was then closed using 0 Vicryl in a running fashion.  The subcutaneous layer was irrigated, then reapproximated with 2-0 plain gut interrupted stitches, and 30 ml of 0.5% Marcaine was injected  subcutaneously around the incision.  The skin was closed with a 4-0 Vicryl subcuticular stitch. Steri strips then placed  and pressure dressing applied.  The patient tolerated the procedure well. Sponge, lap, instrument and needle counts were correct x 3.  She was taken to the recovery room in stable condition.    Disposition: PACU - hemodynamically stable.   Maternal Condition: stable    Signed: Frederik PearJulie P Dona Walby, MD OB Fellow 03/04/2017 11:13 AM

## 2017-03-04 NOTE — Anesthesia Procedure Notes (Addendum)
Spinal  Patient location during procedure: OR Start time: 03/04/2017 9:29 AM End time: 03/04/2017 9:32 AM Staffing Anesthesiologist: Cecile HearingURK, Jefferson Fullam EDWARD Performed: anesthesiologist  Preanesthetic Checklist Completed: patient identified, surgical consent, pre-op evaluation, timeout performed, IV checked, risks and benefits discussed and monitors and equipment checked Spinal Block Patient position: sitting Prep: site prepped and draped and DuraPrep Patient monitoring: continuous pulse ox and blood pressure Approach: midline Location: L3-4 Injection technique: single-shot Needle Needle type: Pencan  Needle gauge: 24 G Additional Notes Functioning IV was confirmed and monitors were applied. Sterile prep and drape, including hand hygiene, mask and sterile gloves were used. The patient was positioned and the spine was prepped. The skin was anesthetized with lidocaine.  Free flow of clear CSF was obtained prior to injecting local anesthetic into the CSF.  The spinal needle aspirated freely following injection.  The needle was carefully withdrawn.  The patient tolerated the procedure well. Consent was obtained prior to procedure with all questions answered and concerns addressed. Risks including but not limited to bleeding, infection, nerve damage, paralysis, failed block, inadequate analgesia, allergic reaction, high spinal, itching and headache were discussed and the patient wished to proceed.   SRNA Lucretia RoersJason Slabach with Dr. Desmond Lopeurk assisting.  Arrie AranStephen Sumayyah Custodio, MD

## 2017-03-04 NOTE — Lactation Note (Signed)
This note was copied from a baby's chart. Lactation Consultation Note  Spanish video interpreter used [id number]. Baby 7 hours old.  P3.  Ex BF for 7 months and 2 years. Mother states she recently breastfed infant for 5 min. Reviewed waking techniques, supply and demand and basics. Mother states she knows how to hand express.  Encouraged hand expressing often and before feedings. Reviewed spoon feeding to interest baby in feeding longer. Mom encouraged to feed baby 8-12 times/24 hours and with feeding cues.  Mom made aware of O/P services, breastfeeding support groups, community resources, and our phone # for post-discharge questions in Spanish.   Patient Name: Julie Allen WUJWJ'XToday's Date: 03/04/2017 Reason for consult: Initial assessment   Maternal Data Has patient been taught Hand Expression?: Yes Does the patient have breastfeeding experience prior to this delivery?: Yes  Feeding Feeding Type: Breast Fed Length of feed: 5 min  LATCH Score Latch: Grasps breast easily, tongue down, lips flanged, rhythmical sucking.  Audible Swallowing: A few with stimulation  Type of Nipple: Everted at rest and after stimulation  Comfort (Breast/Nipple): Soft / non-tender  Hold (Positioning): No assistance needed to correctly position infant at breast.  LATCH Score: 9  Interventions    Lactation Tools Discussed/Used     Consult Status Consult Status: Follow-up Date: 03/05/17 Follow-up type: In-patient    Julie Allen, Julie Allen Julie Allen 03/04/2017, 5:18 PM

## 2017-03-04 NOTE — H&P (Signed)
Obstetric Preoperative History and Physical  Julie Allen is a 26 y.o. [id number] with IUP at [redacted]w[redacted]d presenting for scheduled cesarean section.  No acute concerns.   Prenatal Course Source [facility] of Care:  Pregnancy complications or risks: Patient Active Problem List   Diagnosis Date Noted  . DM (diabetes mellitus), gestational 01/15/2017  . Obesity in pregnancy 09/08/2016  . BMI 34.0-34.9,adult 09/08/2016  . History of cesarean delivery x 2 09/08/2016  . Language barrier 09/08/2016  . Transaminitis 09/08/2016  . Pregnancy, supervision, high-risk 09/07/2016  . History of gestational diabetes mellitus (GDM) 11/03/2012  . History of severe pre-eclampsia 10/20/2012  . Prior pregnancy with fetal demise 09/15/2012   She plans to breastfeed She desires Neplanon for postpartum contraception.   Prenatal labs and studies: ABO, Rh: --/--/O POS (08/08 1155) Antibody: NEG (08/08 1155) Rubella: Immune (01/29 0000) RPR: Non Reactive (08/08 1155)  HBsAg: Negative (01/29 0000)  HIV: Non-reactive (01/29 0000)  GBS:  1 hr Glucola  Normal yearly --> 3rd trimester: GDM Genetic screening declined Anatomy US normal  Prenatal Transfer Tool  Maternal Diabetes: Yes:  Diabetes Type:  Diet controlled Genetic Screening: Normal Maternal Ultrasounds/Referrals: Normal Fetal Ultrasounds or other Referrals:  None Maternal Substance Abuse:  No Significant Maternal Medications:  None Significant Maternal Lab Results: None  Past Medical History:  Diagnosis Date  . Gestational diabetes    glyburide  . History of severe pre-eclampsia     Past Surgical History:  Procedure Laterality Date  . CESAREAN SECTION     vertical skin incision  . CESAREAN SECTION N/A 01/18/2013   Procedure: CESAREAN SECTION;  Surgeon: Tilda Burrow, MD;  Location: [facility];  Service: Obstetrics;  Laterality: N/A;    OB History  Gravida Para Term Preterm AB Living  9 3 1 2 5 2   SAB TAB Ectopic Multiple Live Births  5        2    # Outcome Date GA Lbr Len/2nd Weight Sex Delivery Anes PTL Lv  9 Current           8 Term 01/18/13 [redacted]w[redacted]d  7 lb 8.5 oz (3.415 kg) F CS-Classical Spinal  LIV     Birth Comments: Although listed here as "classical", op note states that a LTCS  7 SAB 2012 [redacted]w[redacted]d   U    DEC  6 SAB 2010          5 Preterm 05/07/08 [redacted]w[redacted]d  6 lb 9.8 oz (3 kg) F CS-Unspec   LIV     Birth Comments: c section secondary to pre-eclampsia -- unsure of Uterine Scar location  4 Preterm 2008 [redacted]w[redacted]d    Vag-Spont   FD  3 SAB           2 SAB           1 SAB               Social History   Social History  . Marital status: Married    Spouse name: N/A  . Number of children: N/A  . Years of education: N/A   Social History Main Topics  . Smoking status: Never Smoker  . Smokeless tobacco: Never Used  . Alcohol use No  . Drug use: No  . Sexual activity: Yes    Birth control/ protection: None   Other Topics Concern  . None   Social History Narrative  . None    Family History  Problem Relation Age of Onset  . Asthma Neg  Hx   . Arthritis Neg Hx   . Alcohol abuse Neg Hx   . Birth defects Neg Hx   . COPD Neg Hx   . Cancer Neg Hx   . Depression Neg Hx   . Diabetes Neg Hx   . Drug abuse Neg Hx   . Early death Neg Hx   . Hearing loss Neg Hx   . Heart disease Neg Hx   . Hyperlipidemia Neg Hx   . Hypertension Neg Hx   . Kidney disease Neg Hx   . Learning disabilities Neg Hx   . Mental illness Neg Hx   . Mental retardation Neg Hx   . Vision loss Neg Hx   . Stroke Neg Hx   . Miscarriages / Stillbirths Neg Hx     Prescriptions Prior to Admission  Medication Sig Dispense Refill Last Dose  . aspirin EC 81 MG tablet Take 1 tablet (81 mg total) by mouth daily. 60 tablet 3 Taking  . Prenatal Vit-Fe Fumarate-FA (PRENATAL MULTIVITAMIN) TABS Take 1 tablet by mouth daily at 12 noon.   Taking    No Known Allergies  Review of Systems: Negative except for what is mentioned in HPI.  Physical Exam: BP 100/80    Pulse 77   Temp 98.4 F (36.9 C) (Oral)   Resp 18   Ht 5\' 1"  (1.549 m)   Wt 187 lb (84.8 kg)   LMP 06/04/2016 (Exact Date)   BMI 35.33 kg/m  FHR by Doppler: 130 bpm CONSTITUTIONAL: Well-developed, well-nourished female in no acute distress.  HENT:  Normocephalic, atraumatic, External right and left ear normal. Oropharynx is clear and moist EYES: Conjunctivae and EOM are normal. Pupils are equal, round, and reactive to light. No scleral icterus.  NECK: Normal range of motion, supple, no masses SKIN: Skin is warm and dry. No rash noted. Not diaphoretic. No erythema. No pallor. NEUROLGIC: Alert and oriented to person, place, and time. Normal reflexes, muscle tone coordination. No cranial nerve deficit noted. PSYCHIATRIC: Normal mood and affect. Normal behavior. Normal judgment and thought content. CARDIOVASCULAR: Normal heart rate noted, regular rhythm RESPIRATORY: Effort and breath sounds normal, no problems with respiration noted ABDOMEN: Soft, nontender, nondistended, gravid. Midline incision noted PELVIC: Deferred MUSCULOSKELETAL: Normal range of motion. No edema and no tenderness. 2+ distal pulses.   Pertinent Labs/Studies:   Results for orders placed or performed during the hospital encounter of 03/04/17 (from the past 72 hour(s))  Glucose, capillary     Status: None   Collection Time: 03/04/17  8:20 AM  Result Value Ref Range   Glucose-Capillary 91 65 - 99 mg/dL    Assessment and Plan :Julie Allen is a 26 y.o. [id number] at 1960w0d being admitted for scheduled cesarean section. The risks of cesarean section discussed with the patient included but were not limited to: bleeding which may require transfusion or reoperation; infection which may require antibiotics; injury to bowel, bladder, ureters or other surrounding organs; injury to the fetus; need for additional procedures including hysterectomy in the event of a life-threatening hemorrhage; placental abnormalities wth  subsequent pregnancies, incisional problems, thromboembolic phenomenon and other postoperative/anesthesia complications. The patient concurred with the proposed plan, giving informed written consent for the procedure. Patient has been NPO since last night she will remain NPO for procedure. Anesthesia and OR aware. Preoperative prophylactic antibiotics and SCDs ordered on call to the OR. To OR when ready.    Raynelle FanningJulie P. Chaeli Judy, MD OB Fellow Faculty Practice, [facility] [place]

## 2017-03-04 NOTE — Anesthesia Postprocedure Evaluation (Signed)
Anesthesia Post Note  Patient: Julie Allen  Procedure(s) Performed: Procedure(s) (LRB): REPEAT CESAREAN SECTION (N/A)     Patient location during evaluation: PACU Anesthesia Type: Spinal Level of consciousness: oriented and awake and alert Pain management: pain level controlled Vital Signs Assessment: post-procedure vital signs reviewed and stable Respiratory status: spontaneous breathing, respiratory function stable and patient connected to nasal cannula oxygen Cardiovascular status: blood pressure returned to baseline and stable Postop Assessment: no headache, no backache, spinal receding, patient able to bend at knees and no signs of nausea or vomiting Anesthetic complications: no    Last Vitals:  Vitals:   03/04/17 1320 03/04/17 1430  BP: 107/65 (!) 101/59  Pulse: 67 65  Resp:  17  Temp: 37.4 C 37.1 C  SpO2: 96% 95%    Last Pain:  Vitals:   03/04/17 1430  TempSrc: Oral  PainSc:    Pain Goal: Patients Stated Pain Goal: 4 (03/04/17 0806)               Cecile HearingStephen Edward Deondrick Searls

## 2017-03-04 NOTE — Anesthesia Preprocedure Evaluation (Signed)
Anesthesia Evaluation  Patient identified by MRN, date of birth, ID band Patient awake    Reviewed: Allergy & Precautions, NPO status , Patient's Chart, lab work & pertinent test results  Airway Mallampati: II  TM Distance: >3 FB Neck ROM: Full    Dental  (+) Teeth Intact, Dental Advisory Given   Pulmonary neg pulmonary ROS,    Pulmonary exam normal breath sounds clear to auscultation       Cardiovascular Exercise Tolerance: Good negative cardio ROS Normal cardiovascular exam Rhythm:Regular Rate:Normal     Neuro/Psych negative neurological ROS  negative psych ROS   GI/Hepatic negative GI ROS, Neg liver ROS,   Endo/Other  diabetes, Gestational, Oral Hypoglycemic AgentsObesity   Renal/GU negative Renal ROS     Musculoskeletal negative musculoskeletal ROS (+)   Abdominal   Peds  Hematology negative hematology ROS (+) Blood dyscrasia, anemia , Plt 266k   Anesthesia Other Findings Day of surgery medications reviewed with the patient.  Reproductive/Obstetrics (+) Pregnancy                             Anesthesia Physical Anesthesia Plan  ASA: II  Anesthesia Plan: Spinal   Post-op Pain Management:    Induction:   PONV Risk Score and Plan: 2 and Ondansetron, Dexamethasone and Treatment may vary due to age or medical condition  Airway Management Planned:   Additional Equipment:   Intra-op Plan:   Post-operative Plan:   Informed Consent: I have reviewed the patients History and Physical, chart, labs and discussed the procedure including the risks, benefits and alternatives for the proposed anesthesia with the patient or authorized representative who has indicated his/her understanding and acceptance.   Dental advisory given  Plan Discussed with: CRNA, Anesthesiologist and Surgeon  Anesthesia Plan Comments: (Discussed risks and benefits of and differences between spinal and  general. Discussed risks of spinal including headache, backache, failure, bleeding, infection, and nerve damage. Patient consents to spinal. Questions answered. Coagulation studies and platelet count acceptable.)        Anesthesia Quick Evaluation

## 2017-03-04 NOTE — Transfer of Care (Signed)
Immediate Anesthesia Transfer of Care Note  Patient: Julie Allen  Procedure(s) Performed: Procedure(s): REPEAT CESAREAN SECTION (N/A)  Patient Location: PACU  Anesthesia Type:Spinal  Level of Consciousness: awake, alert  and oriented  Airway & Oxygen Therapy: Patient Spontanous Breathing  Post-op Assessment: Report given to RN and Post -op Vital signs reviewed and stable  Post vital signs: Reviewed and stable  Last Vitals:  Vitals:   03/04/17 0806  BP: 100/80  Pulse: 77  Resp: 18  Temp: 36.9 C    Last Pain:  Vitals:   03/04/17 0806  TempSrc: Oral      Patients Stated Pain Goal: 4 (03/04/17 0806)  Complications: No apparent anesthesia complications

## 2017-03-05 LAB — CBC
HCT: 30.6 % — ABNORMAL LOW (ref 36.0–46.0)
Hemoglobin: 9.7 g/dL — ABNORMAL LOW (ref 12.0–15.0)
MCH: 23.7 pg — AB (ref 26.0–34.0)
MCHC: 31.7 g/dL (ref 30.0–36.0)
MCV: 74.6 fL — AB (ref 78.0–100.0)
PLATELETS: 231 10*3/uL (ref 150–400)
RBC: 4.1 MIL/uL (ref 3.87–5.11)
RDW: 15.6 % — AB (ref 11.5–15.5)
WBC: 7.9 10*3/uL (ref 4.0–10.5)

## 2017-03-05 LAB — GLUCOSE, CAPILLARY: Glucose-Capillary: 75 mg/dL (ref 65–99)

## 2017-03-05 LAB — BIRTH TISSUE RECOVERY COLLECTION (PLACENTA DONATION)

## 2017-03-05 NOTE — Lactation Note (Signed)
This note was copied from a baby's chart. Lactation Consultation Note  Patient Name: Girl Ezzie DuralLorena Abarca-Tapia ZOXWR'UToday's Date: 03/05/2017 Reason for consult: Follow-up assessmentwith this experienced breast feeding mom and term baby. Mom sates the baby is having trouble latching, acting very hungry, but on and off the breast, crying. On exam of his mouth, he has a short, tight posterior frenulum, with some lingual restrictions. I showed this to mom, and through [place], [place] interorter, explained that a nipple shield might help. At least until mom's milk comes in. Mom is now 30 hours post partum.  I placed a 20 nipple shield with a good fit, and filled the shjield with formula. The baby latched and began sucking, with a deep latch and good breast movement. I then fed the baby under the shield with a curved tip syringe. The baby was satiated after this. He took at least 10 ml's  I advised mom to use the shield for tonight, fill it with formula, and allow baby to suckle until he loses interest. If baby still hungyr, supplement with bottle, and then pump, at least every 3 hours for 15 minutes, followed with HE.  Mom's nurse, Richardson LandryBrandee. was to set up DEP for mom, show her how to use, and how to apply shield.  I spoke to faculty MD about my findings and feeding plan.    Maternal Data    Feeding Feeding Type: Breast Fed Length of feed: 10 min  LATCH Score Latch: Repeated attempts needed to sustain latch, nipple held in mouth throughout feeding, stimulation needed to elicit sucking reflex. (baby latched better with nipple shield filled with formula)  Audible Swallowing: A few with stimulation  Type of Nipple: Everted at rest and after stimulation  Comfort (Breast/Nipple): Soft / non-tender  Hold (Positioning): Assistance needed to correctly position infant at breast and maintain latch.  LATCH Score: 7  Interventions Interventions: Assisted with latch;Hand express;Support pillows;Position  options;Breast compression;DEBP  Lactation Tools Discussed/Used Tools: Nipple Dorris CarnesShields;Other (comment) (syringe) Nipple shield size: 20   Consult Status Consult Status: Follow-up Date: 03/06/17 Follow-up type: In-patient    Alfred LevinsLee, Marquan Vokes Anne 03/05/2017, 6:17 PM

## 2017-03-05 NOTE — Progress Notes (Signed)
Post Partum Day 1  Subjective:  Julie Allen is a 26 y.o. [id number] 5719w0d s/p RLTCS.  No acute events overnight.  Pt denies problems with ambulating, voiding or po intake.  She reports nausea or vomiting.  Pain is well controlled.  She has had flatus. She has not had bowel movement.  Lochia Small.  Plan for birth control is Nexplanon.  Method of Feeding: Breast  Objective: BP (!) 105/52 (BP Location: Left Arm) Comment: pt asymptomatic  Pulse 72   Temp 98.8 F (37.1 C) (Oral)   Resp 18   Ht 5\' 1"  (1.549 m)   Wt 187 lb (84.8 kg)   LMP 06/04/2016 (Exact Date)   SpO2 95%   Breastfeeding? Unknown   BMI 35.33 kg/m   Physical Exam:  General: alert, cooperative and no distress Lochia:normal flow Chest: Normal WOB Heart: RRR no m/r/g Abdomen: +BS, soft, nontender, fundus firm at/below umbilicus Uterine Fundus: firm DVT Evaluation: No evidence of DVT seen on physical exam. Extremities: No edema   Recent Labs  03/03/17 1155 03/05/17 0613  HGB 10.7* 9.7*  HCT 34.1* 30.6*    Assessment/Plan:  ASSESSMENT: Julie Allen is a 26 y.o. [id number] 3019w0d ppd #1 s/p rLTCS doing well.  Appropriate EBL. Hb 10.7 to 9.7.    Plan for discharge tomorrow   LOS: 1 day   John GiovanniCorey P Cox 03/05/2017, 8:07 AM   CNM attestation Post Partum Day #1 I have seen and examined this patient and agree with above documentation in the resident's note.   Visit conducted with Spanish interpreter.  Julie Allen is a 26 y.o. [id number] s/p rLTCS.  Pt denies problems with ambulating, voiding or po intake. Pain is well controlled.  Plan for birth control is Nexplanon.  Method of Feeding: both  PE:  BP 105/67 (BP Location: Left Arm)   Pulse 70   Temp 98.9 F (37.2 C) (Oral)   Resp 18   Ht 5\' 1"  (1.549 m)   Wt 84.8 kg (187 lb)   LMP 06/04/2016 (Exact Date)   SpO2 95%   Breastfeeding? Unknown   BMI 35.33 kg/m  Fundus firm  Plan for discharge: 03/06/17  Cam HaiSHAW, Destini Cambre, CNM 3:10  PM 03/05/2017

## 2017-03-06 MED ORDER — OXYCODONE HCL 5 MG PO TABS: 5.0000 mg | ORAL_TABLET | ORAL | 0 refills | Status: AC | PRN

## 2017-03-06 MED ORDER — IBUPROFEN 600 MG PO TABS: 600.0000 mg | ORAL_TABLET | Freq: Four times a day (QID) | ORAL | 0 refills | Status: AC | PRN

## 2017-03-06 NOTE — Discharge Summary (Signed)
* visit conducted with Spanish interpreter   OB Discharge Summary     Patient Name: Julie Allen DOB: February 14, 1991 MRN: [id number]  Date of admission: 03/04/2017 Delivering MD: Adam PhenixARNOLD, JAMES G   Date of discharge: 03/06/2017  Admitting diagnosis: PREVIOUS C-SECTION Intrauterine pregnancy: 4965w0d     Secondary diagnosis:  Active Problems:   History of cesarean delivery x 2   DM (diabetes mellitus), gestational   S/P repeat low transverse C-section  Additional problems: hx pre-e     Discharge diagnosis: Term Pregnancy Delivered and GDM A1                                                                                                Post partum procedures:none  Augmentation: N/A  Complications: None  Hospital course:  Sceduled C/S   26 y.o. yo [id number] at 2065w0d was admitted to the hospital 03/04/2017 for scheduled cesarean section with the following indication:Elective Repeat.  Membrane Rupture Time/Date: 10:14 AM ,03/04/2017   Patient delivered a Viable infant.03/04/2017  Details of operation can be found in separate operative note.  Pateint had an uncomplicated postpartum course.  She is ambulating, tolerating a regular diet, passing flatus, and urinating well. Patient is discharged home in stable condition on  03/06/17         Physical exam  Vitals:   03/05/17 0530 03/05/17 0930 03/05/17 1719 03/06/17 0530  BP: (!) 105/52 105/67 94/70 104/68  Pulse: 72 70 74 64  Resp: 18 18 17 18   Temp: 98.8 F (37.1 C) 98.9 F (37.2 C) 98.5 F (36.9 C) 98.1 F (36.7 C)  TempSrc: Oral Oral Oral Oral  SpO2: 95%   99%  Weight:      Height:       General: alert and cooperative Lochia: appropriate Uterine Fundus: firm Incision: honeycomb saturated, unchanged; will need to be changed DVT Evaluation: No evidence of DVT seen on physical exam. Labs: Lab Results  Component Value Date   WBC 7.9 03/05/2017   HGB 9.7 (L) 03/05/2017   HCT 30.6 (L) 03/05/2017   MCV 74.6 (L) 03/05/2017   PLT  231 03/05/2017   CMP Latest Ref Rng & Units 01/01/2017  Glucose 65 - 99 mg/dL 98  BUN 6 - 20 mg/dL 5(L)  Creatinine 1.470.57 - 1.00 mg/dL 8.29(F0.46(L)  Sodium 621134 - 308144 mmol/L 139  Potassium 3.5 - 5.2 mmol/L 4.0  Chloride 96 - 106 mmol/L 105  CO2 18 - 29 mmol/L 19  Calcium 8.7 - 10.2 mg/dL 6.5(H8.6(L)  Total Protein 6.0 - 8.5 g/dL 6.3  Total Bilirubin 0.0 - 1.2 mg/dL <8.4<0.2  Alkaline Phos 39 - 117 IU/L 266(H)  AST 0 - 40 IU/L 30  ALT 0 - 32 IU/L 38(H)    Discharge instruction: per After Visit Summary and "Baby and Me Booklet".  After visit meds:  Allergies as of 03/06/2017   No Known Allergies     Medication List    STOP taking these medications   aspirin EC 81 MG tablet     TAKE these medications   ibuprofen 600 MG tablet Commonly known as:  ADVIL,MOTRIN  Take 1 tablet (600 mg total) by mouth every 6 (six) hours as needed.   oxyCODONE 5 MG immediate release tablet Commonly known as:  Oxy IR/ROXICODONE Take 1 tablet (5 mg total) by mouth every 4 (four) hours as needed (pain scale 4-7).   prenatal multivitamin Tabs tablet Take 1 tablet by mouth daily at 12 noon.       Diet: routine diet  Activity: Advance as tolerated. Pelvic rest for 6 weeks.   Outpatient follow up:6 weeks- with a glucola: inbox sent to [facility] Follow up Appt:Future Appointments Date Time Provider Department Center  04/12/2017 1:20 PM Marny Lowenstein, PA-C [facility]   Follow up Visit:No Follow-up on file.  Postpartum contraception: Nexplanon  Newborn Data: Live born female  Birth Weight: 6 lb 7.7 oz (2940 g) APGAR: 9, 9  Baby Feeding: Bottle and Breast Disposition:home with mother   03/06/2017 Cam Hai, CNM  7:43 AM

## 2017-03-11 ENCOUNTER — Encounter: Payer: Self-pay | Admitting: Family Medicine

## 2017-04-12 ENCOUNTER — Encounter: Payer: Self-pay | Admitting: General Practice

## 2017-04-12 ENCOUNTER — Ambulatory Visit: Payer: Self-pay | Admitting: Medical

## 2017-04-28 ENCOUNTER — Ambulatory Visit: Payer: Self-pay | Admitting: Family Medicine

## 2018-02-14 IMAGING — US US MFM FETAL BPP W/O NON-STRESS
1 series · 14 of 28 positions shown · non-contrast
Comparison: none

[Series 1: us mfm fetal bpp w/o non-stress · 40 acquisitions, 14 frames shown]
[im 2/40]
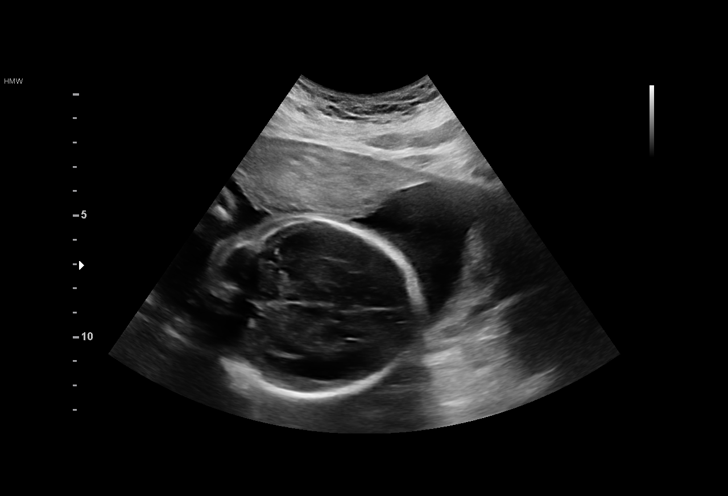
[im 5/40]
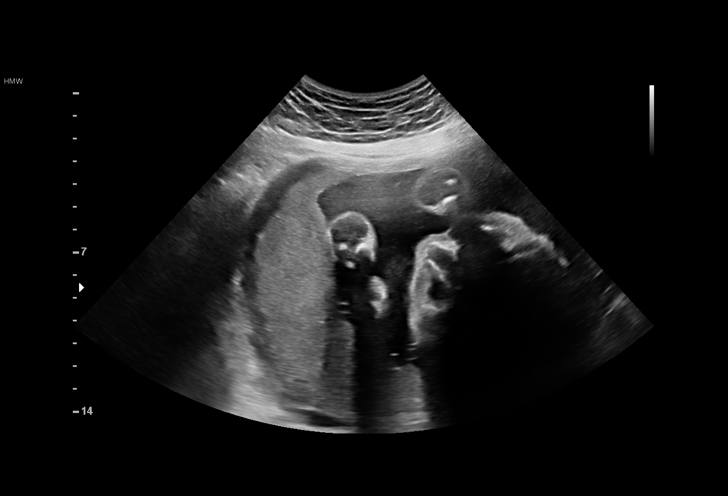
[im 8/40]
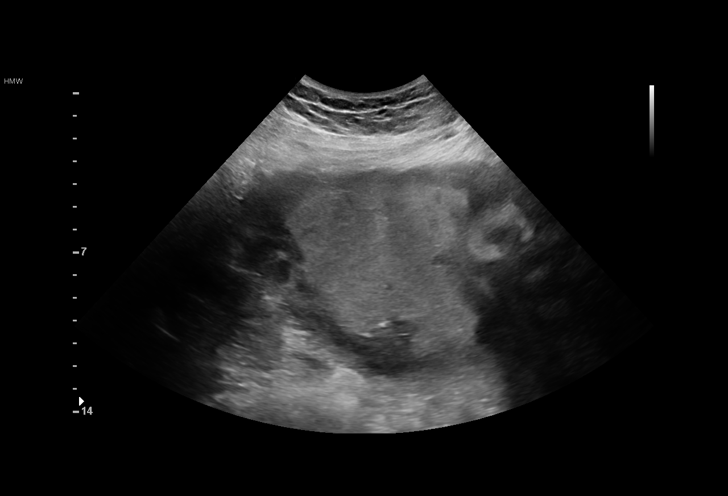
[im 11/40]
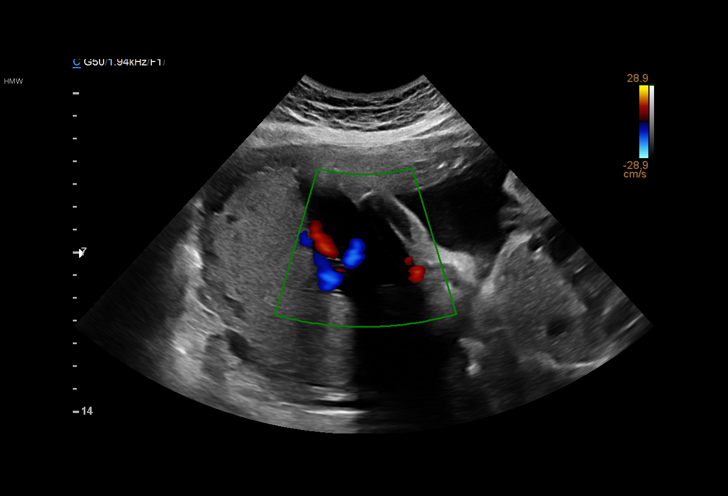
[im 14/40]
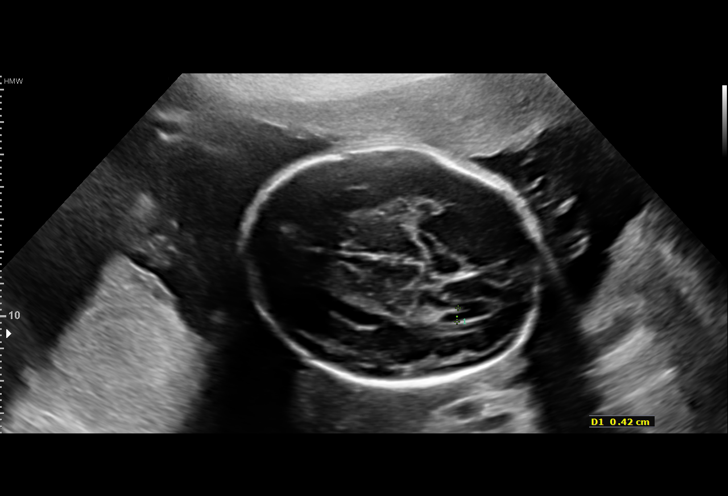
[im 16/40]
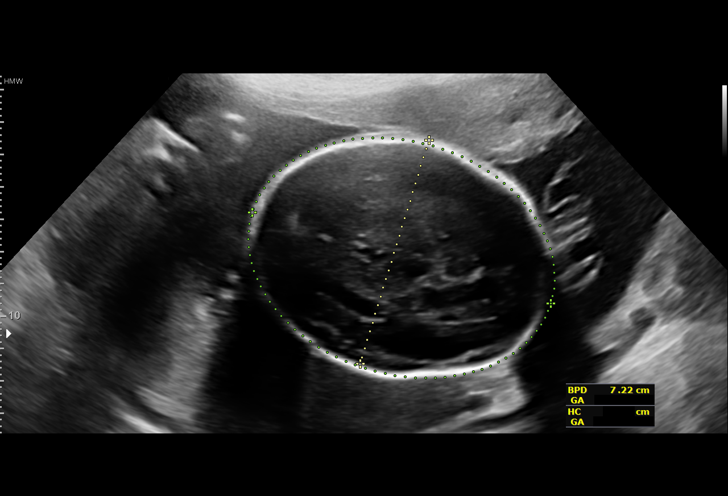
[im 19/40]
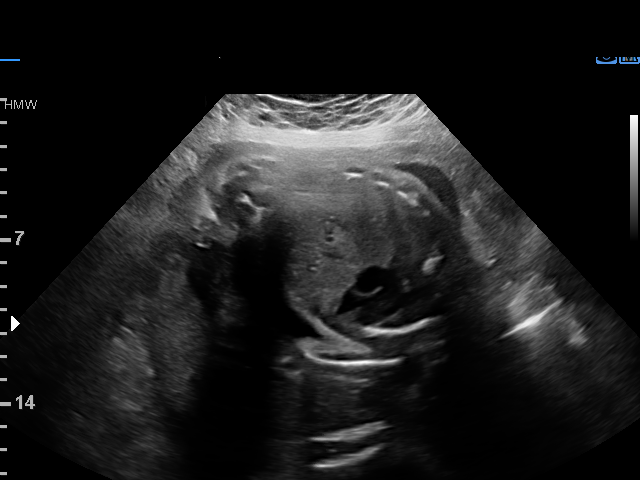
[im 22/40]
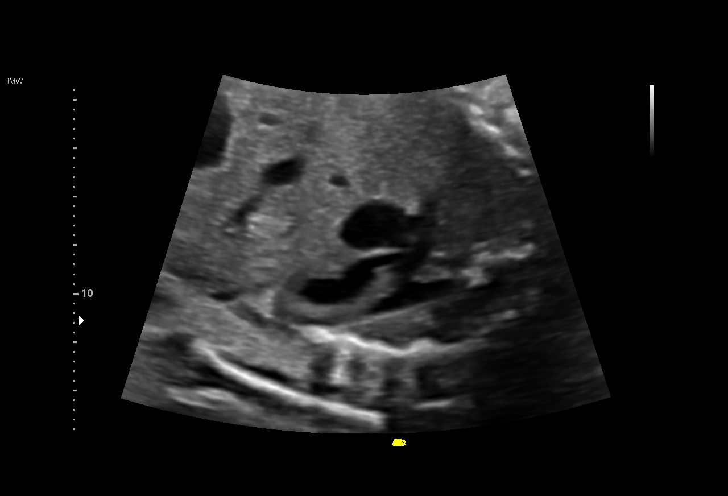
[im 25/40]
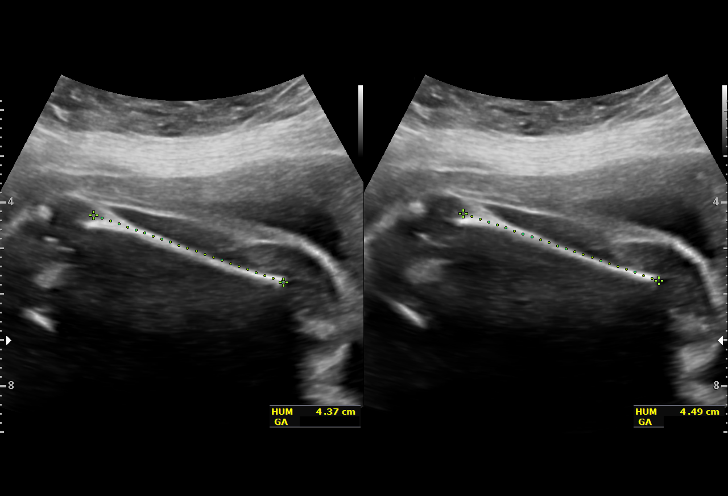
[im 28/40]
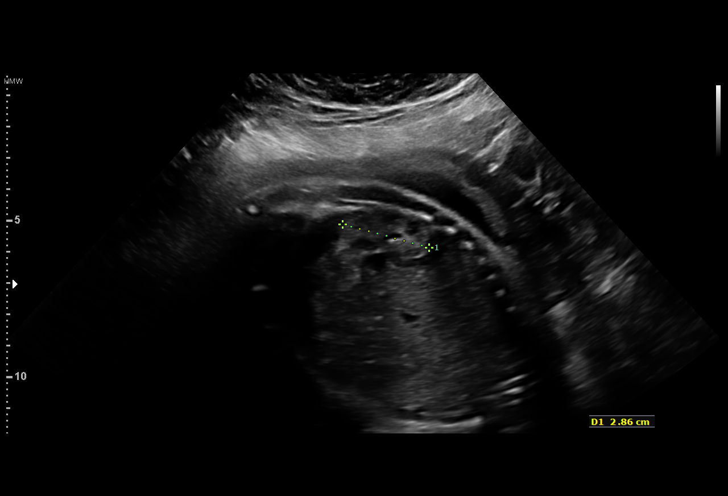
[im 31/40]
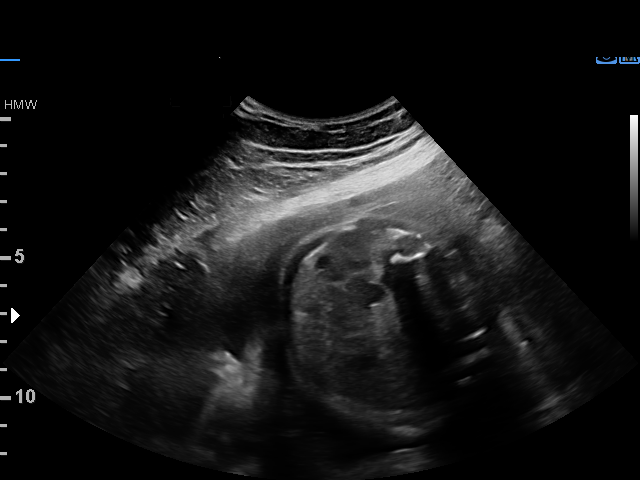
[im 34/40]
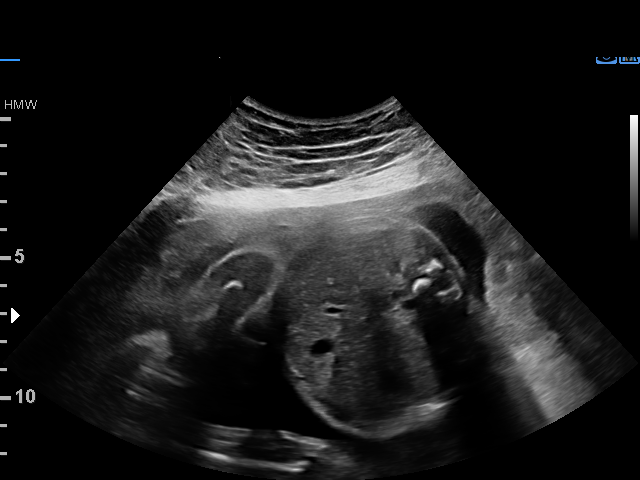
[im 37/40]
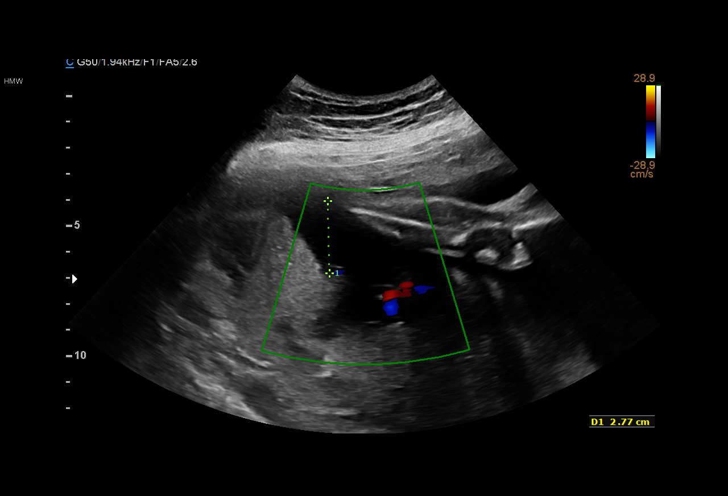
[im 40/40]
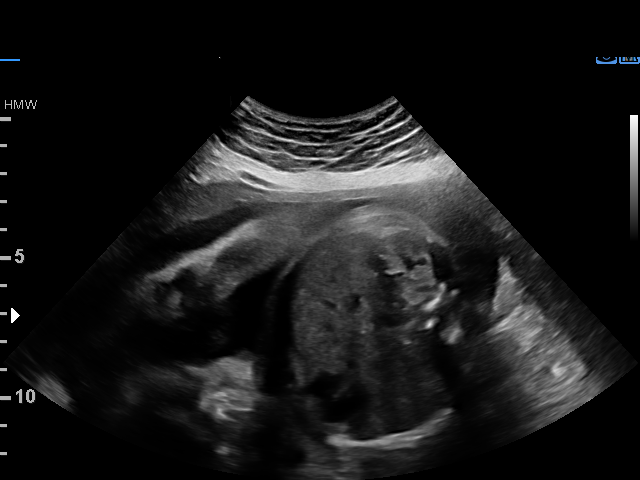

[14 of 28 positions shown; findings below may reference images not displayed]

am)


1  ZAE WANG            [id number]      [id number]     [id number]
2  CHIAYI ODY           [id number]      [id number]     [id number]
Indications

28 weeks gestation of pregnancy
Poor obstetric history: Previous IUFD
(stillbirth); 34 weeks
Poor obstetric history: Previous
preeclampsia / eclampsia/gestational HTN
Poor obstetric history: Previous preterm
delivery, antepartum at 35 weeks
Poor obstetric history-Recurrent (habitual)
abortion (3 consecutive ab's)
Poor obstetric history: Previous gestational
diabetes
Obesity complicating pregnancy, second
trimester
History of cesarean delivery, currently
pregnant
OB History

Blood Type:            Height:  5'0"   Weight (lb):  173      BMI:
Gravidity:    9         Term:   1        Prem:   2        SAB:   5
TOP:          0       Ectopic:  0        Living: 2
Fetal Evaluation

Num Of Fetuses:     1
Fetal Heart         129
Rate(bpm):
Cardiac Activity:   Observed
Presentation:       Cephalic
Placenta:           Fundal, above cervical os
P. Cord Insertion:  Visualized, central

Amniotic Fluid
AFI FV:      Subjectively within normal limits

AFI Sum(cm)     %Tile       Largest Pocket(cm)
12.58           33

RUQ(cm)       RLQ(cm)       LUQ(cm)        LLQ(cm)
4.01
Biophysical Evaluation

Amniotic F.V:   Within normal limits       F. Tone:        Observed
F. Movement:    Observed                   Score:          [DATE]
F. Breathing:   Observed
Biometry

BPD:      72.1  mm     G. Age:  29w 0d         69  %    CI:        73.65   %   70 - 86
FL/HC:      18.2   %   18.8 -
HC:      266.9  mm     G. Age:  29w 1d         53  %    HC/AC:      1.11       1.05 -
AC:      240.1  mm     G. Age:  28w 2d         51  %    FL/BPD:     67.5   %   71 - 87
FL:       48.7  mm     G. Age:  26w 3d          5  %    FL/AC:      20.3   %   20 - 24
HUM:      44.3  mm     G. Age:  26w 2d          8  %

Est. FW:    5511  gm      2 lb 8 oz     46  %
Gestational Age

LMP:           28w 0d       Date:   06/04/16                 EDD:   03/11/17
U/S Today:     28w 2d                                        EDD:   03/09/17
Best:          28w 0d    Det. By:   LMP  (06/04/16)          EDD:   03/11/17
Anatomy

Cranium:               Appears normal         Aortic Arch:            Previously seen
Cavum:                 Previously seen        Ductal Arch:            Not well visualized
Ventricles:            Appears normal         Diaphragm:              Previously seen
Choroid Plexus:        Previously seen        Stomach:                Appears normal, left
sided
Cerebellum:            Previously seen        Abdomen:                Previously seen
Posterior Fossa:       Previously seen        Abdominal Wall:         Previously seen
Nuchal Fold:           Previously seen        Cord Vessels:           Previously seen
Face:                  Orbits and profile     Kidneys:                Appear normal
previously seen
Lips:                  Previously seen        Bladder:                Appears normal
Thoracic:              Appears normal         Spine:                  Previously seen
Heart:                 Previously seen        Upper Extremities:      Previously seen
RVOT:                  Appears normal         Lower Extremities:      Previously seen
LVOT:                  Appears normal

Other:  Fetus appears to be a female. Technically difficult due to maternal
habitus and fetal position.
Cervix Uterus Adnexa

Cervix
Length:            4.4  cm.
Normal appearance by transabdominal scan.
Uterus
No abnormality visualized.

Left Ovary
Within normal limits.

Right Ovary
Within normal limits.

Cul De Sac:   No free fluid seen.

Adnexa:       No abnormality visualized.
Impression

Singleton intrauterine pregnancy at 28 weeks 0 days
gestation with fetal cardiac activity
Cephalic presentation
Fundal placenta
Normal appearing fetal growth and amniotic fluid volume
Ductal arch not well visualized today secondary to fetal
position and maternal habitus
BPP [DATE] with an AFI > 12 cm
Normal appearing cervical length
Recommendations

Provider and patient to discuss and see if they would like for
to repeat further sonographic evaluations to evaluate fetal
ductal arch
Antenatal testing as ordered by provider

## 2018-02-21 IMAGING — US US MFM FETAL BPP W/O NON-STRESS
1 series · 15 of 28 positions shown · non-contrast
Comparison: none

[Series 1: us mfm fetal bpp w/o non-stress · 31 acquisitions, 15 frames shown]
[im 1/31]
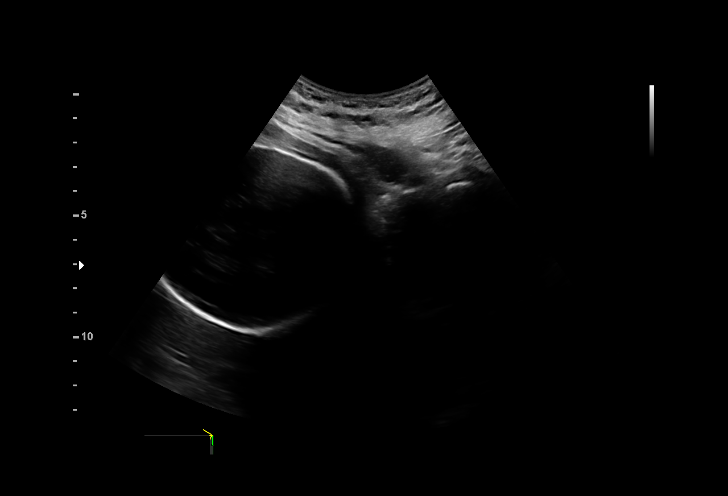
[im 3/31]
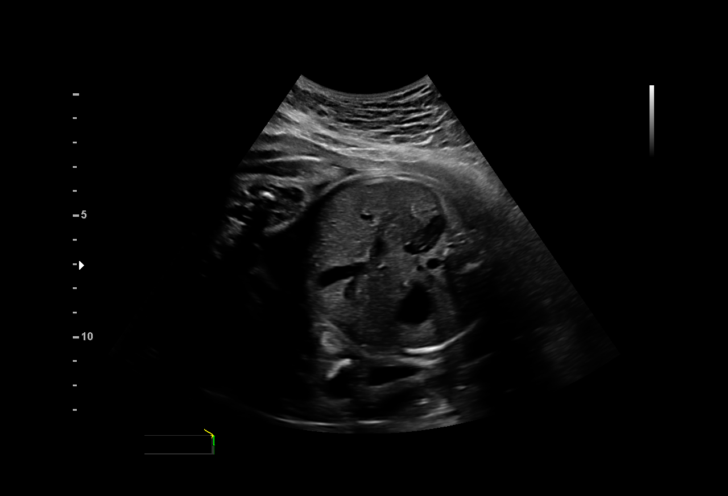
[im 5/31]
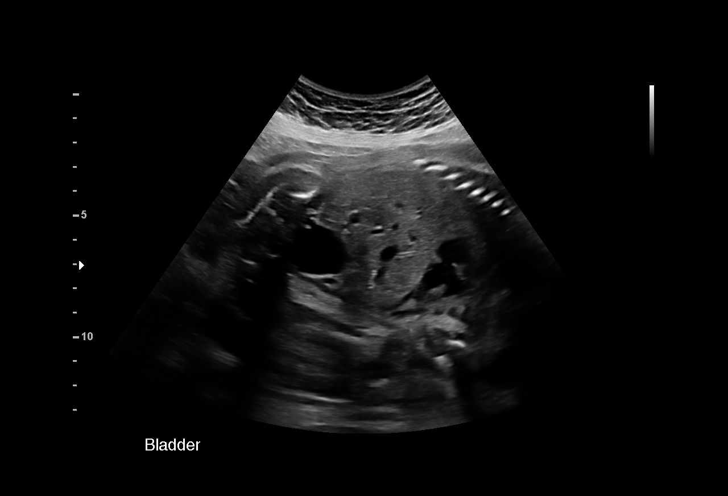
[im 7/31]
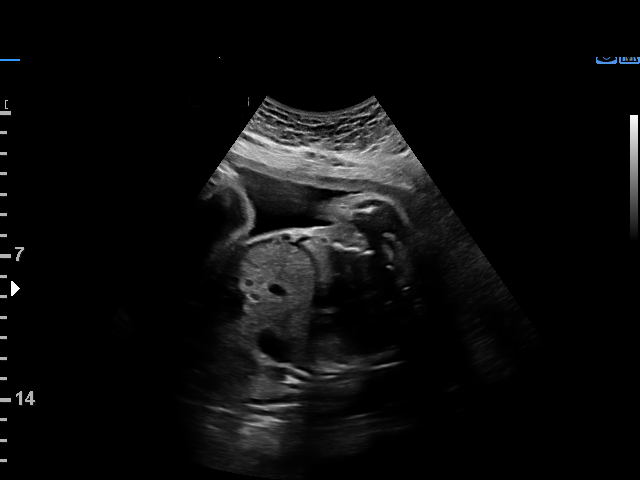
[im 9/31]
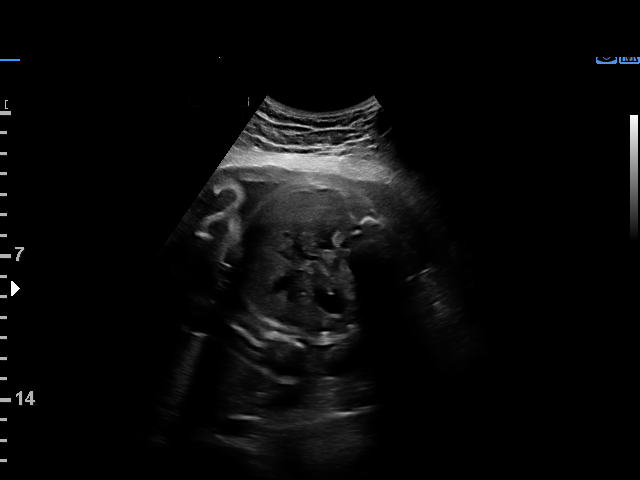
[im 12/31]
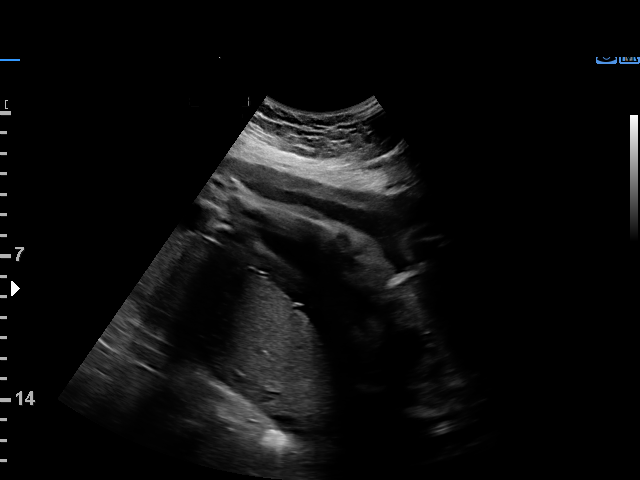
[im 14/31]
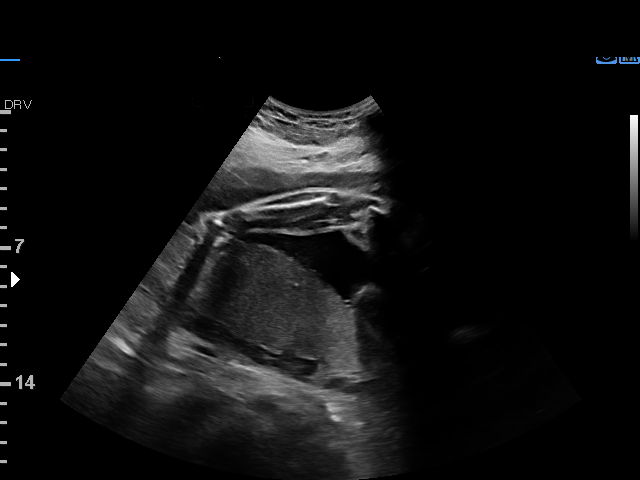
[im 16/31]
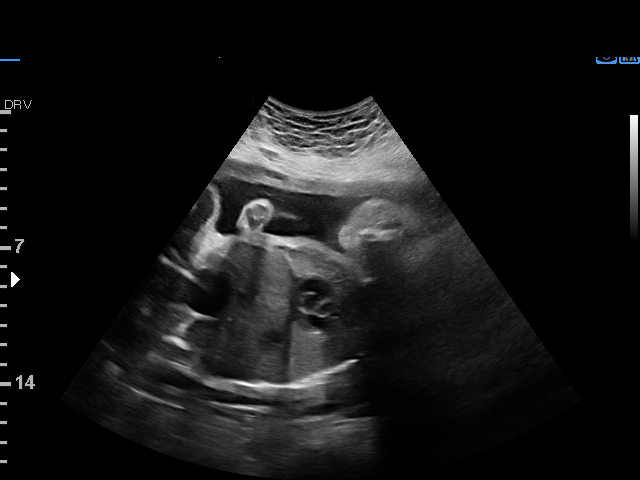
[im 17/31]
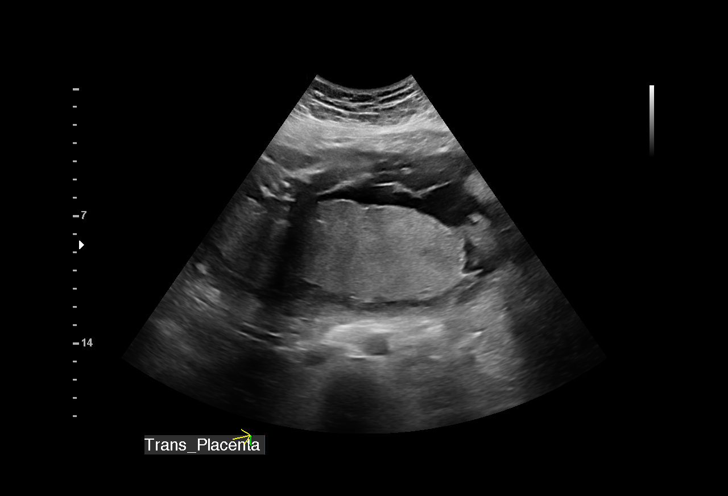
[im 19/31]
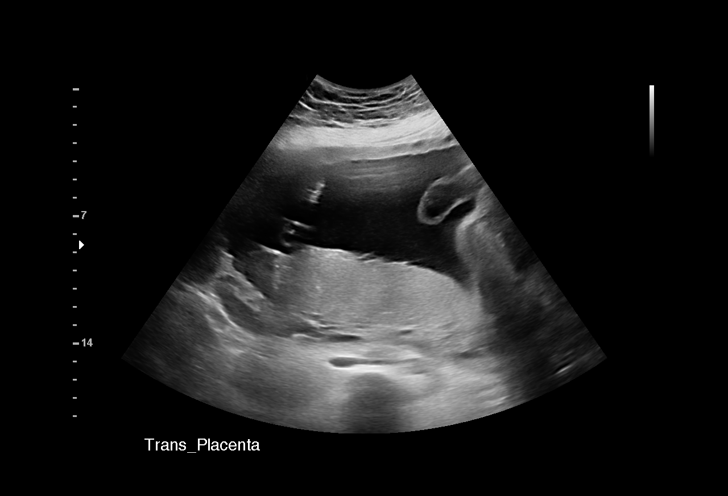
[im 22/31]
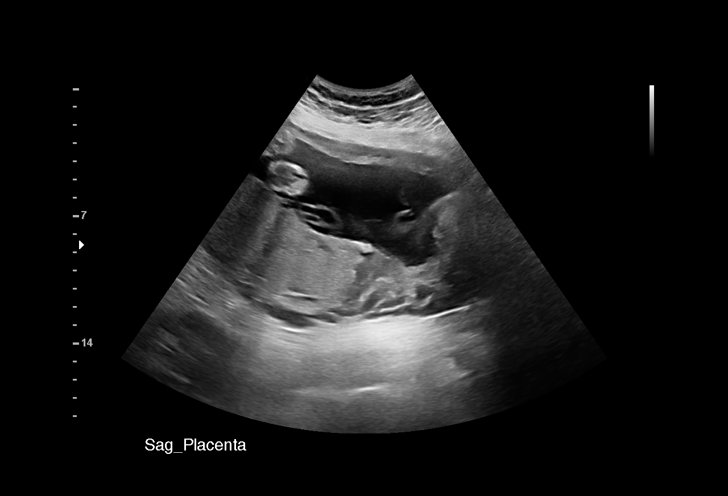
[im 24/31]
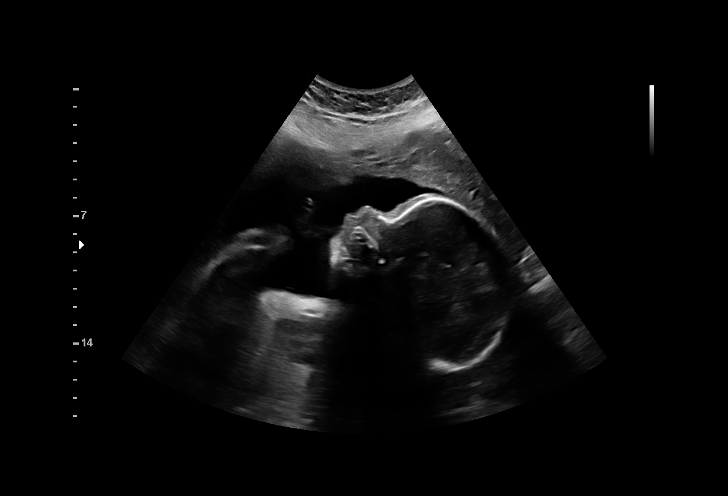
[im 26/31]
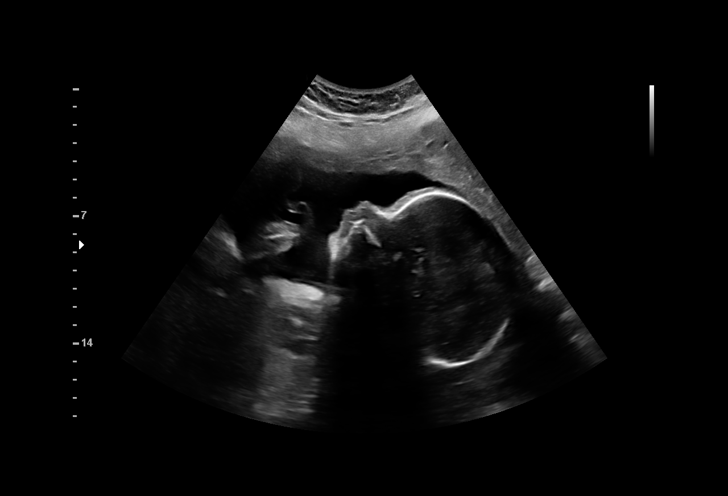
[im 28/31]
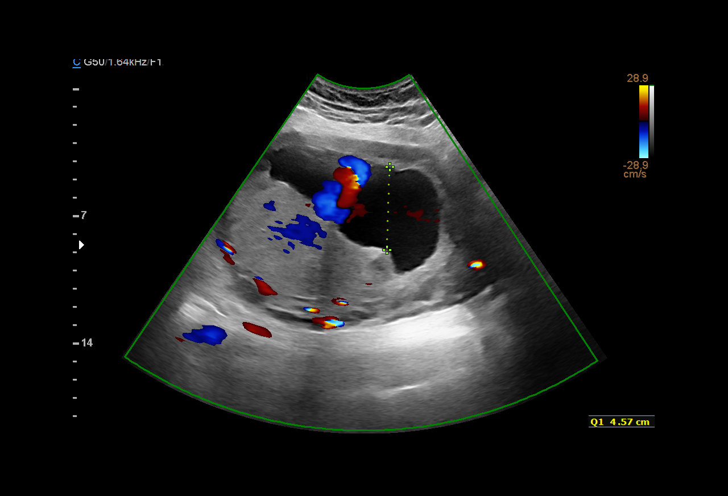
[im 31/31]
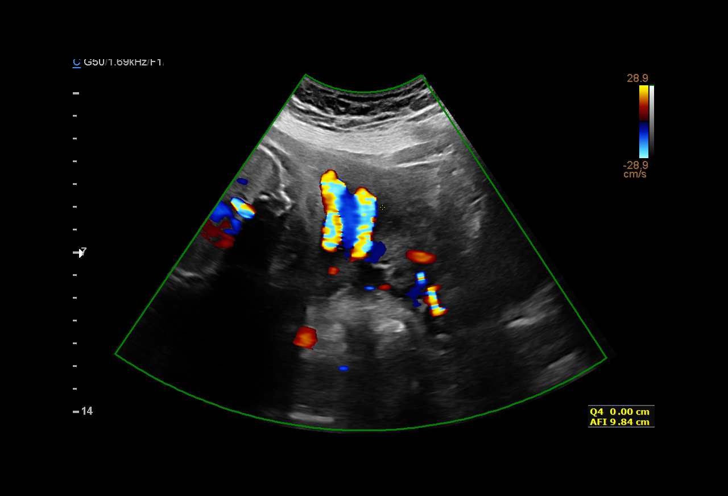

[15 of 28 positions shown; findings below may reference images not displayed]

1  WAJID ULLAH BAKHSHEE           [id number]      [id number]     [id number]
Indications

29 weeks gestation of pregnancy
Poor obstetric history: Previous IUFD
(stillbirth); 34 weeks
Poor obstetric history: Previous
preeclampsia / eclampsia/gestational HTN
Poor obstetric history: Previous preterm
delivery, antepartum at 35 weeks
Poor obstetric history-Recurrent (habitual)
abortion (3 consecutive ab's)
Poor obstetric history: Previous gestational
diabetes
Obesity complicating pregnancy, third
trimester
OB History

Blood Type:            Height:  5'0"   Weight (lb):  173      BMI:
Gravidity:    9         Term:   1        Prem:   2        SAB:   5
TOP:          0       Ectopic:  0        Living: 2
Fetal Evaluation

Num Of Fetuses:     1
Fetal Heart         144
Rate(bpm):
Cardiac Activity:   Observed
Presentation:       Cephalic
Placenta:           Posterior, above cervical os
P. Cord Insertion:  Previously Visualized
Amniotic Fluid
AFI FV:      Subjectively within normal limits

AFI Sum(cm)     %Tile       Largest Pocket(cm)
9.84            10

RUQ(cm)       RLQ(cm)       LUQ(cm)        LLQ(cm)
4.57          0             3.27           2
Biophysical Evaluation

Amniotic F.V:   Within normal limits       F. Tone:        Observed
F. Movement:    Observed                   Score:          [DATE]
F. Breathing:   Observed
Gestational Age

LMP:           29w 0d       Date:   06/04/16                 EDD:   03/11/17
Best:          29w 0d    Det. By:   LMP  (06/04/16)          EDD:   03/11/17
Cervix Uterus Adnexa

Cervix
Not visualized (advanced GA >04wks)
Impression

SIUP at 29+0 weeks
Normal amniotic fluid volume
BPP [DATE]
Recommendations

Continue weekly BPPs until 32 weeks; then can switch to
twice weekly NSTs with weekly AFIs

## 2024-02-04 ENCOUNTER — Ambulatory Visit (INDEPENDENT_AMBULATORY_CARE_PROVIDER_SITE_OTHER): Payer: Self-pay | Admitting: Family

## 2024-02-04 ENCOUNTER — Encounter: Payer: Self-pay | Admitting: Family

## 2024-02-04 ENCOUNTER — Other Ambulatory Visit: Payer: Self-pay

## 2024-02-04 ENCOUNTER — Ambulatory Visit: Payer: Self-pay

## 2024-02-04 VITALS — BP 112/75 | HR 91 | Temp 99.1°F | Resp 16 | Ht 61.0 in | Wt 188.6 lb

## 2024-02-04 DIAGNOSIS — R21 Rash and other nonspecific skin eruption: Secondary | ICD-10-CM

## 2024-02-04 DIAGNOSIS — Z23 Encounter for immunization: Secondary | ICD-10-CM

## 2024-02-04 DIAGNOSIS — Z603 Acculturation difficulty: Secondary | ICD-10-CM

## 2024-02-04 DIAGNOSIS — R0789 Other chest pain: Secondary | ICD-10-CM

## 2024-02-04 DIAGNOSIS — Z758 Other problems related to medical facilities and other health care: Secondary | ICD-10-CM

## 2024-02-04 DIAGNOSIS — Z7689 Persons encountering health services in other specified circumstances: Secondary | ICD-10-CM

## 2024-02-04 MED ORDER — NYSTATIN-TRIAMCINOLONE 100000-0.1 UNIT/GM-% EX OINT: 1.0000 | TOPICAL_OINTMENT | Freq: Two times a day (BID) | CUTANEOUS | 2 refills | Status: AC

## 2024-02-04 MED ORDER — OMEPRAZOLE 20 MG PO CPDR: 20.0000 mg | DELAYED_RELEASE_CAPSULE | Freq: Every day | ORAL | 0 refills | Status: AC

## 2024-02-04 NOTE — Progress Notes (Signed)
Rash on chest.

## 2024-02-04 NOTE — Telephone Encounter (Signed)
 FYI Only or Action Required?: FYI only for provider.  Patient was last seen in primary care on N/A, new patient.  Called Nurse Triage reporting Rash.  Symptoms began several weeks ago.  Interventions attempted: Other: vitacilina cream.  Symptoms are: painful, itchy rash red little pimples to center of chest gradually improving.  Triage Disposition: See Physician Within 24 Hours- patient scheduled for new patient appointment today with [facility]  Patient/caregiver understands and will follow disposition?: Yes               Copied from CRM [id number]. Topic: Clinical - Red Word Triage >> Feb 04, 2024 10:40 AM Harlene ORN wrote: Red Word that prompted transfer to Nurse Triage: pain in her chest and a red rash in her chest Reason for Disposition  [1] Localized rash is very painful AND [2] no fever  Answer Assessment - Initial Assessment Questions Spanish interpreter in use (name: Ellender SCRIVENER [id number]) Patient states she has been using vitacilina cream to the rash.  1. APPEARANCE of RASH: What does the rash look like? (e.g., blisters, dry flaky skin, red spots, redness, sores)     Red, little pimples.  2. LOCATION: Where is the rash located?      Chest, in the middle.  3. NUMBER: How many spots are there?      10.  4. SIZE: How big are the spots? (e.g., inches, cm; or compare to size of pinhead, tip of pen, eraser, pea)      About 4cm.  5. ONSET: When did the rash start?      X 2 weeks.  6. ITCHING: Does the rash itch? If Yes, ask: How bad is the itch?  (Scale 0-10; or none, mild, moderate, severe)     Yes, the itching was severe but has improved after using the cream.  7. PAIN: Does the rash hurt? If Yes, ask: How bad is the pain?  (Scale 0-10; or none, mild, moderate, severe)     8/10 pain at the rash, at night if she lies on her stomach  8. OTHER SYMPTOMS: Do you have any other symptoms? (e.g., fever)     Patient denies fever, swelling,  shortness of breath.  9. PREGNANCY: Is there any chance you are pregnant? When was your last menstrual period?     LMP: She states she has Nexplanon birth control implant and does not have a period.  Protocols used: Rash or Redness - Localized-A-AH

## 2024-02-04 NOTE — Progress Notes (Signed)
 Subjective:    Julie Allen - 33 y.o. female MRN [id number]  Date of birth: [birth year]  HPI  Julie Allen is to establish care.   Current issues and/or concerns: - States rash between bilateral breasts for 2.5 weeks. Denies red flag symptoms.  - States intermittent chest pressure. States worse with walking, exercise, and when laying on stomach. States began 11 years ago after childbirth. - No further issues/concerns for discussion today.  ROS per HPI    Health Maintenance:   Health Maintenance Due  Topic Date Due   HEMOGLOBIN A1C  Never done   FOOT EXAM  Never done   OPHTHALMOLOGY EXAM  Never done   Diabetic kidney evaluation - Urine ACR  Never done   Hepatitis C Screening  Never done   HPV VACCINES (1 - 3-dose SCDM series) Never done   Diabetic kidney evaluation - eGFR measurement  01/01/2018   Cervical Cancer Screening (HPV/Pap Cotest)  03/25/2020   COVID-19 Vaccine (1 - 2024-25 season) Never done     Past Medical History: Patient Active Problem List   Diagnosis Date Noted   S/P repeat low transverse C-section 03/04/2017   DM (diabetes mellitus), gestational 01/15/2017   Obesity in pregnancy 09/08/2016   BMI 34.0-34.9,adult 09/08/2016   History of cesarean delivery x 2 09/08/2016   Language barrier 09/08/2016   Transaminitis 09/08/2016   Pregnancy, supervision, high-risk 09/07/2016   History of gestational diabetes mellitus (GDM) 11/03/2012   History of severe pre-eclampsia 10/20/2012   Prior pregnancy with fetal demise 09/15/2012      Social History   reports that she has never smoked. She has never used smokeless tobacco. She reports that she does not drink alcohol and does not use drugs.   Family History  family history is not on file.   Medications: reviewed and updated   Objective:   Physical Exam BP 112/75   Pulse 91   Temp 99.1 F (37.3 C) (Oral)   Resp 16   Ht 5' 1 (1.549 m)   Wt 188 lb 9.6 oz (85.5 kg)   LMP  (Exact Date)    SpO2 93%   BMI 35.64 kg/m   Physical Exam HENT:     Head: Normocephalic and atraumatic.     Nose: Nose normal.     Mouth/Throat:     Mouth: Mucous membranes are moist.     Pharynx: Oropharynx is clear.  Eyes:     Extraocular Movements: Extraocular movements intact.     Conjunctiva/sclera: Conjunctivae normal.     Pupils: Pupils are equal, round, and reactive to light.  Cardiovascular:     Rate and Rhythm: Normal rate and regular rhythm.     Pulses: Normal pulses.     Heart sounds: Normal heart sounds.  Pulmonary:     Effort: Pulmonary effort is normal.     Breath sounds: Normal breath sounds.  Musculoskeletal:        General: Normal range of motion.     Cervical back: Normal range of motion and neck supple.  Skin:    General: Skin is warm and dry.     Findings: Rash present.  Neurological:     General: No focal deficit present.     Mental Status: She is alert and oriented to person, place, and time.  Psychiatric:        Mood and Affect: Mood normal.        Behavior: Behavior normal.     Assessment & Plan:  1. Encounter  to establish care (Primary) - Patient presents today to establish care. During the interim follow-up with primary provider as scheduled.  - Return for annual physical examination, labs, and health maintenance. Arrive fasting meaning having no food for at least 8 hours prior to appointment. You may have only water or black coffee. Please take scheduled medications as normal.  2. Rash and nonspecific skin eruption - Nystatin -Triamcinolone  as prescribed. Counseled on medication adherence/adverse effects.  - Referral to Dermatology for evaluation/management.  - Follow-up with primary provider as scheduled. - nystatin -triamcinolone  ointment (MYCOLOG); Apply 1 Application topically 2 (two) times daily.  Dispense: 60 g; Refill: 2 - Ambulatory referral to Dermatology  3. Chest pressure - Patient today in office with no cardiopulmonary/acute distress.  -  Omeprazole  as prescribed. Counseled on medication adherence/adverse effects. - Routine screening.  - EKG for evaluation. - Referral to Cardiology for evaluation/management. - EKG 12-Lead - CBC - CMP14+EGFR - omeprazole  (PRILOSEC) 20 MG capsule; Take 1 capsule (20 mg total) by mouth daily.  Dispense: 90 capsule; Refill: 0 - Ambulatory referral to Cardiology  4. Immunization due - Administered. - Heplisav-B  (HepB-CPG) Vaccine  5. Language barrier - [place] in-person interpreter [place].   Patient was given clear instructions to go to Emergency Department or return to medical center if symptoms don't improve, worsen, or new problems develop.The patient verbalized understanding.  I discussed the assessment and treatment plan with the patient. The patient was provided an opportunity to ask questions and all were answered. The patient agreed with the plan and demonstrated an understanding of the instructions.   The patient was advised to call back or seek an in-person evaluation if the symptoms worsen or if the condition fails to improve as anticipated.    Greig Drones, NP 02/04/2024, 4:36 PM Primary Care at [facility]

## 2024-02-05 LAB — CBC
Hematocrit: 44.4 % (ref 34.0–46.6)
Hemoglobin: 14.5 g/dL (ref 11.1–15.9)
MCH: 29.7 pg (ref 26.6–33.0)
MCHC: 32.7 g/dL (ref 31.5–35.7)
MCV: 91 fL (ref 79–97)
Platelets: 311 x10E3/uL (ref 150–450)
RBC: 4.89 x10E6/uL (ref 3.77–5.28)
RDW: 12.7 % (ref 11.7–15.4)
WBC: 6.6 x10E3/uL (ref 3.4–10.8)

## 2024-02-05 LAB — CMP14+EGFR
ALT: 103 IU/L — ABNORMAL HIGH (ref 0–32)
AST: 75 IU/L — ABNORMAL HIGH (ref 0–40)
Albumin: 4.3 g/dL (ref 3.9–4.9)
Alkaline Phosphatase: 133 IU/L — ABNORMAL HIGH (ref 44–121)
BUN/Creatinine Ratio: 17 (ref 9–23)
BUN: 9 mg/dL (ref 6–20)
Bilirubin Total: 0.3 mg/dL (ref 0.0–1.2)
CO2: 19 mmol/L — ABNORMAL LOW (ref 20–29)
Calcium: 9.4 mg/dL (ref 8.7–10.2)
Chloride: 104 mmol/L (ref 96–106)
Creatinine, Ser: 0.52 mg/dL — ABNORMAL LOW (ref 0.57–1.00)
Globulin, Total: 3.2 g/dL (ref 1.5–4.5)
Glucose: 155 mg/dL — ABNORMAL HIGH (ref 70–99)
Potassium: 4.4 mmol/L (ref 3.5–5.2)
Sodium: 141 mmol/L (ref 134–144)
Total Protein: 7.5 g/dL (ref 6.0–8.5)
eGFR: 126 mL/min/1.73 (ref >59)

## 2024-02-07 ENCOUNTER — Ambulatory Visit: Payer: Self-pay | Admitting: Family

## 2024-08-30 ENCOUNTER — Encounter: Payer: Self-pay | Admitting: Family

## 2025-02-09 ENCOUNTER — Ambulatory Visit: Payer: Self-pay | Admitting: Family
